# Patient Record
Sex: Male | Born: 1975 | Race: Black or African American | Hispanic: No | Marital: Single | Smoking: Current some day smoker
Health system: Southern US, Community
[De-identification: ages and names within clinical notes are randomized; demographics above are authoritative.]

## PROBLEM LIST (undated history)

## (undated) DIAGNOSIS — I219 Acute myocardial infarction, unspecified: Secondary | ICD-10-CM

## (undated) HISTORY — PX: FINGER SURGERY: SHX640

## (undated) HISTORY — PX: ROTATOR CUFF REPAIR: SHX139

---

## 1998-07-02 ENCOUNTER — Encounter: Payer: Self-pay | Admitting: Emergency Medicine

## 1998-07-02 ENCOUNTER — Emergency Department (HOSPITAL_COMMUNITY): Admission: EM | Admit: 1998-07-02 | Discharge: 1998-07-02 | Payer: Self-pay | Admitting: Emergency Medicine

## 1998-11-25 ENCOUNTER — Encounter: Payer: Self-pay | Admitting: Emergency Medicine

## 1998-11-25 ENCOUNTER — Emergency Department (HOSPITAL_COMMUNITY): Admission: EM | Admit: 1998-11-25 | Discharge: 1998-11-25 | Payer: Self-pay | Admitting: Emergency Medicine

## 1999-02-01 ENCOUNTER — Emergency Department (HOSPITAL_COMMUNITY): Admission: EM | Admit: 1999-02-01 | Discharge: 1999-02-01 | Payer: Self-pay | Admitting: Emergency Medicine

## 1999-03-29 ENCOUNTER — Emergency Department (HOSPITAL_COMMUNITY): Admission: EM | Admit: 1999-03-29 | Discharge: 1999-03-29 | Payer: Self-pay | Admitting: Emergency Medicine

## 1999-06-02 ENCOUNTER — Emergency Department (HOSPITAL_COMMUNITY): Admission: EM | Admit: 1999-06-02 | Discharge: 1999-06-02 | Payer: Self-pay | Admitting: Emergency Medicine

## 1999-10-03 ENCOUNTER — Encounter: Payer: Self-pay | Admitting: Emergency Medicine

## 1999-10-03 ENCOUNTER — Emergency Department (HOSPITAL_COMMUNITY): Admission: EM | Admit: 1999-10-03 | Discharge: 1999-10-03 | Payer: Self-pay | Admitting: Emergency Medicine

## 2000-01-27 ENCOUNTER — Emergency Department (HOSPITAL_COMMUNITY): Admission: EM | Admit: 2000-01-27 | Discharge: 2000-01-27 | Payer: Self-pay

## 2000-05-08 ENCOUNTER — Emergency Department (HOSPITAL_COMMUNITY): Admission: EM | Admit: 2000-05-08 | Discharge: 2000-05-08 | Payer: Self-pay | Admitting: Emergency Medicine

## 2000-05-12 ENCOUNTER — Emergency Department (HOSPITAL_COMMUNITY): Admission: EM | Admit: 2000-05-12 | Discharge: 2000-05-12 | Payer: Self-pay | Admitting: Emergency Medicine

## 2000-05-12 ENCOUNTER — Encounter: Payer: Self-pay | Admitting: Emergency Medicine

## 2000-11-18 ENCOUNTER — Emergency Department (HOSPITAL_COMMUNITY): Admission: EM | Admit: 2000-11-18 | Discharge: 2000-11-18 | Payer: Self-pay | Admitting: Emergency Medicine

## 2002-01-25 ENCOUNTER — Emergency Department (HOSPITAL_COMMUNITY): Admission: EM | Admit: 2002-01-25 | Discharge: 2002-01-25 | Payer: Self-pay | Admitting: *Deleted

## 2002-01-25 ENCOUNTER — Encounter: Payer: Self-pay | Admitting: *Deleted

## 2004-06-27 ENCOUNTER — Emergency Department (HOSPITAL_COMMUNITY): Admission: EM | Admit: 2004-06-27 | Discharge: 2004-06-28 | Payer: Self-pay | Admitting: Emergency Medicine

## 2004-12-28 ENCOUNTER — Ambulatory Visit: Payer: Self-pay | Admitting: Internal Medicine

## 2005-08-17 ENCOUNTER — Emergency Department (HOSPITAL_COMMUNITY): Admission: EM | Admit: 2005-08-17 | Discharge: 2005-08-17 | Payer: Self-pay | Admitting: Emergency Medicine

## 2005-09-02 ENCOUNTER — Emergency Department (HOSPITAL_COMMUNITY): Admission: EM | Admit: 2005-09-02 | Discharge: 2005-09-02 | Payer: Self-pay | Admitting: Emergency Medicine

## 2007-03-11 ENCOUNTER — Emergency Department (HOSPITAL_COMMUNITY): Admission: EM | Admit: 2007-03-11 | Discharge: 2007-03-11 | Payer: Self-pay | Admitting: Emergency Medicine

## 2007-05-18 ENCOUNTER — Observation Stay (HOSPITAL_COMMUNITY): Admission: EM | Admit: 2007-05-18 | Discharge: 2007-05-18 | Payer: Self-pay | Admitting: Emergency Medicine

## 2007-05-21 ENCOUNTER — Emergency Department (HOSPITAL_COMMUNITY): Admission: EM | Admit: 2007-05-21 | Discharge: 2007-05-22 | Payer: Self-pay | Admitting: Emergency Medicine

## 2007-05-22 ENCOUNTER — Ambulatory Visit: Payer: Self-pay | Admitting: Internal Medicine

## 2007-08-27 ENCOUNTER — Emergency Department (HOSPITAL_COMMUNITY): Admission: EM | Admit: 2007-08-27 | Discharge: 2007-08-27 | Payer: Self-pay | Admitting: Emergency Medicine

## 2007-09-05 ENCOUNTER — Emergency Department (HOSPITAL_COMMUNITY): Admission: EM | Admit: 2007-09-05 | Discharge: 2007-09-05 | Payer: Self-pay | Admitting: Emergency Medicine

## 2007-09-24 ENCOUNTER — Emergency Department (HOSPITAL_COMMUNITY): Admission: EM | Admit: 2007-09-24 | Discharge: 2007-09-24 | Payer: Self-pay | Admitting: Emergency Medicine

## 2007-11-13 ENCOUNTER — Emergency Department (HOSPITAL_COMMUNITY): Admission: EM | Admit: 2007-11-13 | Discharge: 2007-11-13 | Payer: Self-pay | Admitting: Emergency Medicine

## 2007-11-14 ENCOUNTER — Ambulatory Visit (HOSPITAL_COMMUNITY): Admission: RE | Admit: 2007-11-14 | Discharge: 2007-11-14 | Payer: Self-pay | Admitting: Specialist

## 2008-02-12 ENCOUNTER — Emergency Department (HOSPITAL_COMMUNITY): Admission: EM | Admit: 2008-02-12 | Discharge: 2008-02-12 | Payer: Self-pay | Admitting: Emergency Medicine

## 2008-02-29 ENCOUNTER — Emergency Department (HOSPITAL_COMMUNITY): Admission: EM | Admit: 2008-02-29 | Discharge: 2008-02-29 | Payer: Self-pay | Admitting: Emergency Medicine

## 2008-07-22 ENCOUNTER — Emergency Department (HOSPITAL_COMMUNITY): Admission: EM | Admit: 2008-07-22 | Discharge: 2008-07-23 | Payer: Self-pay | Admitting: *Deleted

## 2008-08-03 ENCOUNTER — Emergency Department (HOSPITAL_COMMUNITY): Admission: EM | Admit: 2008-08-03 | Discharge: 2008-08-03 | Payer: Self-pay | Admitting: Emergency Medicine

## 2008-08-04 ENCOUNTER — Emergency Department (HOSPITAL_COMMUNITY): Admission: EM | Admit: 2008-08-04 | Discharge: 2008-08-04 | Payer: Self-pay | Admitting: Emergency Medicine

## 2008-08-19 ENCOUNTER — Emergency Department (HOSPITAL_COMMUNITY): Admission: EM | Admit: 2008-08-19 | Discharge: 2008-08-19 | Payer: Self-pay | Admitting: Emergency Medicine

## 2008-08-31 ENCOUNTER — Emergency Department (HOSPITAL_COMMUNITY): Admission: EM | Admit: 2008-08-31 | Discharge: 2008-09-01 | Payer: Self-pay | Admitting: Emergency Medicine

## 2008-09-30 ENCOUNTER — Emergency Department (HOSPITAL_COMMUNITY): Admission: EM | Admit: 2008-09-30 | Discharge: 2008-09-30 | Payer: Self-pay | Admitting: Emergency Medicine

## 2008-10-05 ENCOUNTER — Emergency Department (HOSPITAL_COMMUNITY): Admission: EM | Admit: 2008-10-05 | Discharge: 2008-10-05 | Payer: Self-pay | Admitting: Emergency Medicine

## 2008-10-21 ENCOUNTER — Emergency Department (HOSPITAL_COMMUNITY): Admission: EM | Admit: 2008-10-21 | Discharge: 2008-10-21 | Payer: Self-pay | Admitting: Emergency Medicine

## 2008-11-10 ENCOUNTER — Emergency Department (HOSPITAL_COMMUNITY): Admission: EM | Admit: 2008-11-10 | Discharge: 2008-11-10 | Payer: Self-pay | Admitting: Family Medicine

## 2008-12-12 ENCOUNTER — Emergency Department (HOSPITAL_COMMUNITY): Admission: EM | Admit: 2008-12-12 | Discharge: 2008-12-12 | Payer: Self-pay | Admitting: Emergency Medicine

## 2009-01-26 ENCOUNTER — Emergency Department (HOSPITAL_COMMUNITY): Admission: EM | Admit: 2009-01-26 | Discharge: 2009-01-26 | Payer: Self-pay | Admitting: Emergency Medicine

## 2009-02-05 ENCOUNTER — Encounter: Admission: RE | Admit: 2009-02-05 | Discharge: 2009-03-19 | Payer: Self-pay | Admitting: Orthopedic Surgery

## 2009-04-25 ENCOUNTER — Emergency Department (HOSPITAL_COMMUNITY): Admission: EM | Admit: 2009-04-25 | Discharge: 2009-04-25 | Payer: Self-pay | Admitting: Emergency Medicine

## 2009-05-04 ENCOUNTER — Emergency Department (HOSPITAL_COMMUNITY): Admission: EM | Admit: 2009-05-04 | Discharge: 2009-05-04 | Payer: Self-pay | Admitting: Emergency Medicine

## 2009-05-20 ENCOUNTER — Emergency Department (HOSPITAL_COMMUNITY): Admission: EM | Admit: 2009-05-20 | Discharge: 2009-05-20 | Payer: Self-pay | Admitting: Advanced Practice Midwife

## 2009-06-24 ENCOUNTER — Encounter: Admission: RE | Admit: 2009-06-24 | Discharge: 2009-09-22 | Payer: Self-pay | Admitting: Orthopedic Surgery

## 2009-07-06 ENCOUNTER — Emergency Department (HOSPITAL_COMMUNITY): Admission: EM | Admit: 2009-07-06 | Discharge: 2009-07-06 | Payer: Self-pay | Admitting: Emergency Medicine

## 2009-08-16 ENCOUNTER — Emergency Department (HOSPITAL_COMMUNITY): Admission: EM | Admit: 2009-08-16 | Discharge: 2009-08-16 | Payer: Self-pay | Admitting: Emergency Medicine

## 2009-08-19 ENCOUNTER — Ambulatory Visit (HOSPITAL_BASED_OUTPATIENT_CLINIC_OR_DEPARTMENT_OTHER): Admission: RE | Admit: 2009-08-19 | Discharge: 2009-08-19 | Payer: Self-pay | Admitting: Orthopedic Surgery

## 2009-09-10 ENCOUNTER — Emergency Department (HOSPITAL_COMMUNITY): Admission: EM | Admit: 2009-09-10 | Discharge: 2009-09-10 | Payer: Self-pay | Admitting: Emergency Medicine

## 2009-09-14 ENCOUNTER — Emergency Department (HOSPITAL_COMMUNITY): Admission: EM | Admit: 2009-09-14 | Discharge: 2009-09-14 | Payer: Self-pay | Admitting: Emergency Medicine

## 2009-09-16 ENCOUNTER — Emergency Department (HOSPITAL_COMMUNITY): Admission: EM | Admit: 2009-09-16 | Discharge: 2009-09-16 | Payer: Self-pay | Admitting: Emergency Medicine

## 2009-09-24 ENCOUNTER — Encounter: Admission: RE | Admit: 2009-09-24 | Discharge: 2009-10-07 | Payer: Self-pay | Admitting: Orthopedic Surgery

## 2009-10-03 ENCOUNTER — Emergency Department (HOSPITAL_COMMUNITY): Admission: EM | Admit: 2009-10-03 | Discharge: 2009-10-03 | Payer: Self-pay | Admitting: Emergency Medicine

## 2009-10-13 ENCOUNTER — Emergency Department (HOSPITAL_COMMUNITY): Admission: EM | Admit: 2009-10-13 | Discharge: 2009-10-13 | Payer: Self-pay | Admitting: Emergency Medicine

## 2009-10-18 ENCOUNTER — Emergency Department (HOSPITAL_COMMUNITY): Admission: EM | Admit: 2009-10-18 | Discharge: 2009-10-18 | Payer: Self-pay | Admitting: Emergency Medicine

## 2009-11-12 ENCOUNTER — Emergency Department (HOSPITAL_COMMUNITY): Admission: EM | Admit: 2009-11-12 | Discharge: 2009-11-12 | Payer: Self-pay | Admitting: Emergency Medicine

## 2009-11-18 ENCOUNTER — Emergency Department (HOSPITAL_COMMUNITY): Admission: EM | Admit: 2009-11-18 | Discharge: 2009-11-18 | Payer: Self-pay | Admitting: Emergency Medicine

## 2009-12-07 ENCOUNTER — Emergency Department (HOSPITAL_COMMUNITY): Admission: EM | Admit: 2009-12-07 | Discharge: 2009-12-07 | Payer: Self-pay | Admitting: Emergency Medicine

## 2009-12-08 ENCOUNTER — Emergency Department (HOSPITAL_COMMUNITY): Admission: EM | Admit: 2009-12-08 | Discharge: 2009-12-08 | Payer: Self-pay | Admitting: Emergency Medicine

## 2009-12-09 ENCOUNTER — Encounter: Admission: RE | Admit: 2009-12-09 | Discharge: 2009-12-09 | Payer: Self-pay | Admitting: *Deleted

## 2010-09-12 LAB — BASIC METABOLIC PANEL
BUN: 12 mg/dL (ref 6–23)
CO2: 25 mEq/L (ref 19–32)
Calcium: 8.6 mg/dL (ref 8.4–10.5)
Chloride: 105 mEq/L (ref 96–112)
Creatinine, Ser: 1.29 mg/dL (ref 0.4–1.5)
GFR calc Af Amer: >60 mL/min (ref >60)
GFR calc non Af Amer: >60 mL/min (ref >60)
Glucose, Bld: 124 mg/dL — ABNORMAL HIGH (ref 70–99)
Potassium: 4.1 mEq/L (ref 3.5–5.1)
Sodium: 136 mEq/L (ref 135–145)

## 2010-09-22 LAB — BASIC METABOLIC PANEL
CO2: 24 mEq/L (ref 19–32)
Calcium: 8.5 mg/dL (ref 8.4–10.5)
Chloride: 108 mEq/L (ref 96–112)
GFR calc Af Amer: >60 mL/min (ref >60)
Potassium: 3.8 mEq/L (ref 3.5–5.1)
Sodium: 139 mEq/L (ref 135–145)

## 2010-09-22 LAB — RAPID URINE DRUG SCREEN, HOSP PERFORMED
Barbiturates: NOT DETECTED
Benzodiazepines: POSITIVE — AB
Cocaine: NOT DETECTED

## 2010-09-22 LAB — URINALYSIS, ROUTINE W REFLEX MICROSCOPIC
Bilirubin Urine: NEGATIVE
Hgb urine dipstick: NEGATIVE
Ketones, ur: NEGATIVE mg/dL
Nitrite: NEGATIVE
Protein, ur: NEGATIVE mg/dL
Urobilinogen, UA: 1 mg/dL (ref 0.0–1.0)

## 2010-09-22 LAB — CBC
HCT: 41.7 % (ref 39.0–52.0)
Hemoglobin: 14.3 g/dL (ref 13.0–17.0)
MCHC: 34.2 g/dL (ref 30.0–36.0)
RBC: 4.39 MIL/uL (ref 4.22–5.81)

## 2010-09-22 LAB — DIFFERENTIAL
Basophils Relative: 1 % (ref 0–1)
Eosinophils Relative: 4 % (ref 0–5)
Monocytes Absolute: 0.5 10*3/uL (ref 0.1–1.0)
Monocytes Relative: 8 % (ref 3–12)
Neutro Abs: 2.8 10*3/uL (ref 1.7–7.7)

## 2010-09-22 LAB — APTT: aPTT: 27 seconds (ref 24–37)

## 2010-09-22 LAB — URINE MICROSCOPIC-ADD ON

## 2010-09-28 LAB — URINALYSIS, ROUTINE W REFLEX MICROSCOPIC
Glucose, UA: NEGATIVE mg/dL
pH: 6 (ref 5.0–8.0)

## 2010-09-28 LAB — URINE MICROSCOPIC-ADD ON

## 2010-11-02 NOTE — H&P (Signed)
NAMEKENZIE, FLAKES         ACCOUNT NO.:  [id number]   MEDICAL RECORD NO.:  [id number]          PATIENT TYPE:  OBV   LOCATION:  1411                         FACILITY:  [facility]   PHYSICIAN:  Lonia Blood, M.D.      DATE OF BIRTH:  [birth year]   DATE OF ADMISSION:  05/17/2007  DATE OF DISCHARGE:                              HISTORY & PHYSICAL   CHIEF COMPLAINT:  Rectal bleeding.   HISTORY OF PRESENT ILLNESS:  The patient is a 35 year old, African  American male with no significant past medical history.  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  There are no records  to support that.  He came in secondary to 3 days of rectal bleeding with  multiple bowel movements, associated with some abdominal cramp, no  nausea or vomiting.  The patient has not had any prior GI history, no  history of hemorrhoids.  His pain was persistent in addition to the  bleed.  Hence, he came to the emergency room.  Currently, he denied any  specific symptoms except for the bleed.  His abdominal pain has subsided  in the hospital.  It was initially 10/10, now 2/10.   PAST MEDICAL HISTORY:  MI indicated in 1999, but no records to support  that.   ALLERGIES:  SULFA.   MEDICATIONS:  None.   SOCIAL HISTORY:  The patient is single and lives in [place] with his  girlfriend.  He smokes about a pack per day.  Denied alcohol or IV drug  abuse.   FAMILY HISTORY:  Denied any family history of GI cancers or any  malignancy or any GI problems.   REVIEW OF SYSTEMS:  A 12-point review of systems is negative except for  HPI.   PHYSICAL EXAMINATION:  VITAL SIGNS:  Temperature 98.7, blood pressure  105/64, pulse 75, respirations 20, saturations 97% on room air.  The  patient is not orthostatic.  GENERAL:  He is awake, alert and oriented in no acute distress.  HEENT:  PERRLA.  EOMI.  NECK:  Supple.  No JVD, no lymphadenopathy.  LUNGS:  Good air entry bilaterally.  No wheezes or rales.  CARDIAC:  S1, S2, no murmurs.  ABDOMEN:  Soft, nontender, positive bowel sounds.  EXTREMITIES:  No clubbing, cyanosis or edema.  RECTAL:  Guaiac is positive grossly.   LABORATORY DATA AND X-RAY FINDINGS:  White count 6.3, hemoglobin 15,  platelet count 245, normal differential.  Sodium 140, potassium 3.9,  chloride 104, CO2 34, glucose 77, BUN 18, creatinine 1.49, calcium 9.0.  INR 1.0, PT 13.8.   CT of abdomen and pelvis essentially negative.   ASSESSMENT:  This is a 35 year old gentleman presenting with multiple  bouts of rectal bleed today with some abdominal pain.  The patient's  workup so far seems negative except for the guaiac stools.  It appears  that this may be hemorrhoidal bleed from all indications.  However, the  patient denied any prior history.  His rectal exam did show some mild  rectal hemorrhoids, but not clear if that is the cause of his bleed.  The patient's BUN is  actually normal indicating that this is likely to  be lower if anything than upper gastrointestinal bleed.   PLAN:  1. Lower gastrointestinal bleed.  We will admit the patient and keep      him monitored for observation.  Will check his serial CBC to make      sure his hemoglobin stays stable.  Will consider inpatient or      outpatient gastrointestinal workup depending on how stable he is.      Meanwhile, intravenous fluids and I will type and cross-match him      for packed red blood cells and hold in case we need to transfuse.  2. Tobacco abuse.  I have counseled the patient and I will put      nicotine patch on the patient now.  I will also check urine screen      as well.  3. Questionable coronary artery disease.  This is many years ago and      the patient has no symptoms right now.  4. Respiratory alkalosis.  This may be related to the patient's      smoking and maybe he has basilar chronic obstructive pulmonary      disease, but he is not having any shortness of breath.  Final      treatment  will depend on how the patient's bleeding evolves in the      hospital.      Lonia Blood, M.D.  Electronically Signed     LG/MEDQ  D:  05/18/2007  T:  05/18/2007  Job:  [id number]

## 2010-11-02 NOTE — Discharge Summary (Signed)
Jared Spencer, Jared Spencer         ACCOUNT NO.:  [id number]   MEDICAL RECORD NO.:  [id number]          PATIENT TYPE:  OBV   LOCATION:  1411                         FACILITY:  [facility]   PHYSICIAN:  Hillery Aldo, M.D.   DATE OF BIRTH:  Oct 15, 1975   DATE OF ADMISSION:  05/17/2007  DATE OF DISCHARGE:                               DISCHARGE SUMMARY   PRIMARY CARE PHYSICIAN:  None.   DISCHARGE DIAGNOSES:  1. Rectal bleeding secondary to internal hemorrhoids.  2. Possible irritable bowel syndrome.  3. Polysubstance abuse including cocaine.   DISCHARGE MEDICATIONS:  1. Anusol-HC suppositories q.6 h. p.r.n. rectal pain or bleeding.  2. Levsin 0.125 mg sublingual 1-2 tablets q.4 h. p.r.n. abdominal      pain.   CONSULTATION:  Dr. Leone Payor of gastroenterology.   BRIEF ADMISSION HPI:  The patient is a 35 year old male who presented to  the emergency department with a chief complaint of bright red and maroon-  colored stools accompanied by mid epigastric pain.  The patient reported  that he had had a history of ulcers in the past and was having a fair  amount of discomfort.  He was admitted for 23-hour observation.   PROCEDURES AND DIAGNOSTIC STUDIES:  1. CT scan of the abdomen and pelvis on May 18, 2007 showed no      acute upper abdominal or pelvic abnormalities.  2. Flexible proctosigmoidoscopy by Dr. Leone Payor on May 18, 2007      showed internal hemorrhoids, which was thought to be the cause of      bleeding.   DISCHARGE LABORATORY VALUES:  Showed his white blood cell count was 4.9,  hemoglobin 14.1, hematocrit 40, platelet count 220,000.   HOSPITAL COURSE:  1. Rectal bleeding:  The patient was admitted and put on IV proton      pump inhibitor medication.  He was kept n.p.o. and a GI      consultation was requested and kindly provided by Dr. Leone Payor given      that he had a prior history of peptic ulcer disease.  The patient      did undergo flexible sigmoidoscopy with  findings as noted above.      He was put on a combination of hydrocortisone suppositories and      Levsin for treatment of abdominal spasms.  It was also recommended      that the patient avoid any ingestion of cocaine due to the fact      that this can cause significant vasospasm and ischemic problems.      The patient denied using cocaine but agreed to stay away from it in      the future.  The patient was also appraised of the risk of cocaine      use with regard to heart disease and strokes.  The patient had no      significant drop in his hemoglobin throughout his hospital stay.      He therefore is medically stable and will be discharged home.  2. Polysubstance abuse:  The patient's urine drug screen was positive      for cocaine and opiates.  It is unclear if he had received opiates      in the emergency department prior to the collection of the urine      sample sent for drug screening.  Nevertheless, the patient was      counseled extensively with regard to the importance of avoidance of      illicit substances to avoid problems in the future with      cerebrovascular, cardiovascular, or gastrointestinal complications.      The patient was fairly medication-seeking, particularly with      narcotics, throughout his hospital stay, frequently requesting      doses of pain medicine of the nursing staff and frequently asking      whether he would be sent home with pain medications.  The patient      was advised the pain medications will worsen his constipation, and      he should avoid constipation to help prevent problems with his      hemorrhoids.   DISPOSITION:  The patient is medically stable and is being discharged  home.      Hillery Aldo, M.D.  Electronically Signed     CR/MEDQ  D:  05/18/2007  T:  05/18/2007  Job:  [id number]

## 2011-03-14 LAB — CBC
Hemoglobin: 13.9
MCV: 92.6
Platelets: 269
RDW: 13.9

## 2011-03-28 LAB — COMPREHENSIVE METABOLIC PANEL
ALT: 24
Alkaline Phosphatase: 54
CO2: 25
Glucose, Bld: 89
Potassium: 3.5
Sodium: 141
Total Protein: 6.4

## 2011-03-28 LAB — DIFFERENTIAL
Basophils Absolute: 0
Basophils Relative: 1
Monocytes Absolute: 0.6
Neutro Abs: 2.9
Neutrophils Relative %: 43

## 2011-03-28 LAB — CBC
Hemoglobin: 15.3
RBC: 4.62
RDW: 13.1

## 2011-03-28 LAB — URINALYSIS, ROUTINE W REFLEX MICROSCOPIC
Hgb urine dipstick: NEGATIVE
Nitrite: NEGATIVE
Protein, ur: NEGATIVE
Urobilinogen, UA: 0.2

## 2011-03-28 LAB — LIPASE, BLOOD: Lipase: 33

## 2011-03-28 LAB — URINE MICROSCOPIC-ADD ON

## 2011-03-29 LAB — CK TOTAL AND CKMB (NOT AT ARMC): Relative Index: 0.3

## 2011-03-29 LAB — CROSSMATCH

## 2011-03-29 LAB — CBC
HCT: 40.3
Hemoglobin: 14.1
Hemoglobin: 14.3
Hemoglobin: 15
MCHC: 35.5
MCV: 93.4
Platelets: 220
Platelets: 223
RBC: 4.52
RDW: 13.4
WBC: 5.7

## 2011-03-29 LAB — RAPID URINE DRUG SCREEN, HOSP PERFORMED
Barbiturates: NOT DETECTED
Opiates: POSITIVE — AB

## 2011-03-29 LAB — BASIC METABOLIC PANEL
CO2: 34 — ABNORMAL HIGH
Chloride: 104
Creatinine, Ser: 1.49
GFR calc Af Amer: >60
Sodium: 140

## 2011-03-29 LAB — DIFFERENTIAL
Basophils Relative: 0
Eosinophils Absolute: 0.3
Monocytes Absolute: 0.3
Monocytes Relative: 5

## 2011-03-29 LAB — TROPONIN I: Troponin I: 0.03

## 2011-03-29 LAB — CARDIAC PANEL(CRET KIN+CKTOT+MB+TROPI)
CK, MB: 2.2
Relative Index: 0.4

## 2011-03-29 LAB — ABO/RH: ABO/RH(D): O POS

## 2011-03-29 LAB — TSH: TSH: 3.248

## 2011-11-28 ENCOUNTER — Encounter (HOSPITAL_COMMUNITY): Payer: Self-pay | Admitting: *Deleted

## 2011-11-28 ENCOUNTER — Emergency Department (HOSPITAL_COMMUNITY)
Admission: EM | Admit: 2011-11-28 | Discharge: 2011-11-28 | Disposition: A | Discharge status: Home / Self Care | Payer: Medicaid Other | Attending: Emergency Medicine | Admitting: Emergency Medicine

## 2011-11-28 DIAGNOSIS — F17200 Nicotine dependence, unspecified, uncomplicated: Secondary | ICD-10-CM | POA: Insufficient documentation

## 2011-11-28 DIAGNOSIS — IMO0002 Reserved for concepts with insufficient information to code with codable children: Secondary | ICD-10-CM | POA: Insufficient documentation

## 2011-11-28 DIAGNOSIS — Y998 Other external cause status: Secondary | ICD-10-CM | POA: Insufficient documentation

## 2011-11-28 DIAGNOSIS — Y9389 Activity, other specified: Secondary | ICD-10-CM | POA: Insufficient documentation

## 2011-11-28 DIAGNOSIS — S29019A Strain of muscle and tendon of unspecified wall of thorax, initial encounter: Secondary | ICD-10-CM

## 2011-11-28 DIAGNOSIS — X58XXXA Exposure to other specified factors, initial encounter: Secondary | ICD-10-CM | POA: Insufficient documentation

## 2011-11-28 MED ORDER — PREDNISONE 50 MG PO TABS: 50.0000 mg | ORAL_TABLET | Freq: Every day | ORAL | Status: AC

## 2011-11-28 MED ORDER — HYDROCODONE-ACETAMINOPHEN 5-325 MG PO TABS: 1.0000 | ORAL_TABLET | Freq: Four times a day (QID) | ORAL | Status: AC | PRN

## 2011-11-28 MED ORDER — CYCLOBENZAPRINE HCL 10 MG PO TABS: 10.0000 mg | ORAL_TABLET | Freq: Three times a day (TID) | ORAL | Status: AC | PRN

## 2011-11-28 NOTE — Discharge Instructions (Signed)
Return here as needed for any worsening in your condition.  Use ice and heat on your back.  Avoid heavy lifting over the next 7-10 days.

## 2011-11-28 NOTE — ED Provider Notes (Signed)
Medical screening examination/treatment/procedure(s) were performed by non-physician practitioner and as supervising physician I was immediately available for consultation/collaboration.   Forbes Cellar, MD 11/28/11 2032

## 2011-11-28 NOTE — ED Provider Notes (Signed)
History     CSN: [id number]  Arrival date & time 11/28/11  1859   First MD Initiated Contact with Patient 11/28/11 2001      Chief Complaint  Patient presents with  . Back Pain    (Consider location/radiation/quality/duration/timing/severity/associated sxs/prior treatment) HPI Patient presents emergency department with mid lateral right back pain that started this morning while carrying 2 buckets each of 5 gallons of paint.  Patient, states he tripped and when he tripped.  He felt pain, and pulling in his right.  Patient, states the pain has persisted throughout the day and is worse with movement and palpation.  Patient, states he did not fall away to the ground.  Patient denies numbness, weakness, vomiting, nausea, or neck pain.  Patient, states he took Tylenol with minimal relief. History reviewed. No pertinent past medical history.  History reviewed. No pertinent past surgical history.  No family history on file.  History  Substance Use Topics  . Smoking status: Current Everyday Smoker -- 0.2 packs/day  . Smokeless tobacco: Not on file  . Alcohol Use: No      Review of Systems All other systems negative except as documented in the HPI. All pertinent positives and negatives as reviewed in the HPI.  Allergies  Ibuprofen; Sulfa antibiotics; and Benadryl  Home Medications   Current Outpatient Rx  Name Route Sig Dispense Refill  . ACETAMINOPHEN 500 MG PO TABS Oral Take 1,000 mg by mouth every 6 (six) hours as needed. Pain.      BP 128/79  Pulse 60  Temp(Src) 98.6 F (37 C) (Oral)  Resp 18  Wt 180 lb (81.647 kg)  SpO2 100%  Physical Exam  Vitals reviewed. Constitutional: He is oriented to person, place, and time. He appears well-developed and well-nourished.  HENT:  Head: Atraumatic.  Cardiovascular: Normal rate and regular rhythm.   Pulmonary/Chest: Effort normal and breath sounds normal.  Musculoskeletal:       Thoracic back: He exhibits tenderness, pain  and spasm. He exhibits normal range of motion, no bony tenderness and no deformity.       Back:  Neurological: He is alert and oriented to person, place, and time. He has normal strength. No sensory deficit. Coordination and gait normal.  Reflex Scores:      Tricep reflexes are 2+ on the right side and 2+ on the left side.      Bicep reflexes are 2+ on the right side and 2+ on the left side.      Brachioradialis reflexes are 2+ on the right side and 2+ on the left side.   ED Course  Procedures (including critical care time)  Patient has a muscular strain based on his history of present illness and physical exam done.  Patient is told to return here for any worsening in his condition.  I advised him to use ice and heat on his back.  I told him to avoid heavy lifting over the next 7-10 days.  He should not have any trauma and did not fall to the ground, so, therefore, I did not order any x-rays  MDM          Carlyle Dolly, PA-C 11/28/11 2020

## 2011-11-28 NOTE — ED Notes (Signed)
Pt c/o hurting upper back this morning carrying something; feels like may have pulled something; increase in pain during day

## 2012-02-02 ENCOUNTER — Emergency Department (HOSPITAL_COMMUNITY)
Admission: EM | Admit: 2012-02-02 | Discharge: 2012-02-02 | Disposition: A | Discharge status: Home / Self Care | Payer: Medicaid Other | Attending: Emergency Medicine | Admitting: Emergency Medicine

## 2012-02-02 ENCOUNTER — Encounter (HOSPITAL_COMMUNITY): Payer: Self-pay | Admitting: Emergency Medicine

## 2012-02-02 ENCOUNTER — Emergency Department (HOSPITAL_COMMUNITY): Payer: Medicaid Other

## 2012-02-02 DIAGNOSIS — M79609 Pain in unspecified limb: Secondary | ICD-10-CM | POA: Insufficient documentation

## 2012-02-02 DIAGNOSIS — F17200 Nicotine dependence, unspecified, uncomplicated: Secondary | ICD-10-CM | POA: Insufficient documentation

## 2012-02-02 DIAGNOSIS — M79642 Pain in left hand: Secondary | ICD-10-CM

## 2012-02-02 MED ORDER — IBUPROFEN 400 MG PO TABS
400.0000 mg | ORAL_TABLET | Freq: Once | ORAL | Status: DC
  Filled 2012-02-02: qty 1

## 2012-02-02 MED ORDER — OXYCODONE-ACETAMINOPHEN 5-325 MG PO TABS
2.0000 | ORAL_TABLET | Freq: Once | ORAL | Status: AC
  Administered 2012-02-02: 2 via ORAL
  Filled 2012-02-02: qty 2

## 2012-02-02 MED ORDER — OXYCODONE-ACETAMINOPHEN 5-325 MG PO TABS: 1.0000 | ORAL_TABLET | ORAL | Status: AC | PRN

## 2012-02-02 NOTE — ED Notes (Signed)
Pt fell off a ladder 1 month ago falling on his wrist c/o pain to wrist and hand

## 2012-02-02 NOTE — ED Provider Notes (Signed)
History  Scribed for Jared Razor, MD, the patient was seen in room TR05C/TR05C. This chart was scribed by Candelaria Stagers. The patient's care started at 11:09 AM    CSN: [id number]  Arrival date & time 02/02/12  6295   First MD Initiated Contact with Patient 02/02/12 1106      Chief Complaint  Patient presents with  . Wrist Pain     The history is provided by the patient.   TAIM WURM is a 36 y.o. male who presents to the Emergency Department complaining of left wrist pain after falling off a ladder about four weeks ago landing on his wrist.  Pt reports that the pain is worsening.  He states that the pain is worse when carrying objects.  Nothing seems to make the pain better or worse.    History reviewed. No pertinent past medical history.  History reviewed. No pertinent past surgical history.  History reviewed. No pertinent family history.  History  Substance Use Topics  . Smoking status: Current Everyday Smoker -- 0.2 packs/day  . Smokeless tobacco: Not on file  . Alcohol Use: No      Review of Systems  Constitutional: Negative for fever.       10 Systems reviewed and are negative for acute change except as noted in the HPI.  HENT: Negative for congestion.   Eyes: Negative for discharge and redness.  Respiratory: Negative for cough and shortness of breath.   Cardiovascular: Negative for chest pain.  Gastrointestinal: Negative for vomiting and abdominal pain.  Musculoskeletal: Positive for arthralgias (left wrist pain). Negative for back pain.  Skin: Negative for rash.  Neurological: Negative for syncope, numbness and headaches.  Psychiatric/Behavioral:       No behavior change.    Allergies  Ibuprofen; Sulfa antibiotics; and Benadryl  Home Medications  No current outpatient prescriptions on file.  BP 112/82  Pulse 63  Temp 98.5 F (36.9 C) (Oral)  Resp 17  SpO2 100%  Physical Exam  Nursing note and vitals reviewed. Constitutional:   Awake, alert, nontoxic appearance.  HENT:  Head: Atraumatic.  Eyes: Right eye exhibits no discharge. Left eye exhibits no discharge.  Neck: Neck supple.  Cardiovascular: Normal rate and regular rhythm.   Pulmonary/Chest: Effort normal and breath sounds normal. He exhibits no tenderness.  Abdominal: Soft. There is no tenderness. There is no rebound.  Musculoskeletal: He exhibits no tenderness.       Tenderness to the distal ulna of the left wrist.  Tenderness along the mid to distal 5th metatarsal.  Skin intact.  Radial nerve intact.  Good cap refill distally.    Neurological:       Mental status and motor strength appears baseline for patient and situation.  Skin: No rash noted.  Psychiatric: He has a normal mood and affect.    ED Course  Procedures   DIAGNOSTIC STUDIES: Oxygen Saturation is 100% on room air, normal by my interpretation.    COORDINATION OF CARE:  1018 Ordered: DG Wrist Complete Left; DG Hand Complete Left   Labs Reviewed - No data to display Dg Wrist Complete Left  02/02/2012  *RADIOLOGY REPORT*  Clinical Data: Injury  LEFT WRIST - COMPLETE 3+ VIEW  Comparison: None.  Findings: No acute fracture and no dislocation.  Curvilinear radiodensity is present within the soft tissues of the thenar eminence.  IMPRESSION: No acute bony pathology.  Radiopaque foreign body within the thenar soft tissues.  Original Report Authenticated By: Donavan Burnet, M.D.  Dg Hand Complete Left  02/02/2012  *RADIOLOGY REPORT*  Clinical Data: Injury  LEFT HAND - COMPLETE 3+ VIEW  Comparison: 12/07/2009  Findings: Curvilinear radio opaque foreign body in the thenar soft tissues is stable.  Soft tissue swelling about the PIP joint of the index finger and long finger is stable.  Tiny bony density adjacent to the head of the proximal phalanx of the index finger has a chronic appearance.  No definite acute fracture.  IMPRESSION: No acute bony pathology.  Chronic changes.  Original Report  Authenticated By: Donavan Burnet, M.D.     1. Hand pain, left       MDM  36 year old male with hand pain. X-rays are negative for acute osseous injury. There is no tenderness in anatomic snuffbox. Foreign body noted on x-ray does not correspond to patient's injury. Likely chronic. Patient is neurovascularly intact. Plain systematic treatment. Return precautions were discussed.  I personally preformed the services scribed in my presence. The recorded information has been reviewed and considered. Jared Razor, MD.        Jared Razor, MD 02/09/12 1021

## 2012-02-02 NOTE — ED Notes (Signed)
Pt reports falling 4 weeks ago and catching full body weight onto left hand.  Full rom but pain increasing.  Swelling has resolved but pain increasing.  Pt reports decreased strength in pinky and ring finger, decreased grip.  No obvious deformity or edema.  Pt alert oriented X4

## 2012-04-14 ENCOUNTER — Encounter (HOSPITAL_COMMUNITY): Payer: Self-pay | Admitting: Emergency Medicine

## 2012-04-14 DIAGNOSIS — Z8679 Personal history of other diseases of the circulatory system: Secondary | ICD-10-CM | POA: Insufficient documentation

## 2012-04-14 DIAGNOSIS — W108XXA Fall (on) (from) other stairs and steps, initial encounter: Secondary | ICD-10-CM | POA: Insufficient documentation

## 2012-04-14 DIAGNOSIS — Z79899 Other long term (current) drug therapy: Secondary | ICD-10-CM | POA: Insufficient documentation

## 2012-04-14 DIAGNOSIS — M545 Low back pain, unspecified: Secondary | ICD-10-CM | POA: Insufficient documentation

## 2012-04-14 DIAGNOSIS — F17200 Nicotine dependence, unspecified, uncomplicated: Secondary | ICD-10-CM | POA: Insufficient documentation

## 2012-04-14 DIAGNOSIS — I2510 Atherosclerotic heart disease of native coronary artery without angina pectoris: Secondary | ICD-10-CM | POA: Insufficient documentation

## 2012-04-14 DIAGNOSIS — I252 Old myocardial infarction: Secondary | ICD-10-CM | POA: Insufficient documentation

## 2012-04-14 DIAGNOSIS — Y9389 Activity, other specified: Secondary | ICD-10-CM | POA: Insufficient documentation

## 2012-04-14 DIAGNOSIS — S46909A Unspecified injury of unspecified muscle, fascia and tendon at shoulder and upper arm level, unspecified arm, initial encounter: Secondary | ICD-10-CM | POA: Insufficient documentation

## 2012-04-14 DIAGNOSIS — S4980XA Other specified injuries of shoulder and upper arm, unspecified arm, initial encounter: Secondary | ICD-10-CM | POA: Insufficient documentation

## 2012-04-14 DIAGNOSIS — S0993XA Unspecified injury of face, initial encounter: Secondary | ICD-10-CM | POA: Insufficient documentation

## 2012-04-14 DIAGNOSIS — Y9289 Other specified places as the place of occurrence of the external cause: Secondary | ICD-10-CM | POA: Insufficient documentation

## 2012-04-14 NOTE — ED Notes (Addendum)
Pt reports he fell down eleven steps approx. One hour ago and is now having back neck and shoulder pain no deformity noted

## 2012-04-15 ENCOUNTER — Emergency Department (HOSPITAL_COMMUNITY)
Admission: EM | Admit: 2012-04-15 | Discharge: 2012-04-15 | Disposition: A | Discharge status: Home / Self Care | Payer: Medicaid Other | Attending: Emergency Medicine | Admitting: Emergency Medicine

## 2012-04-15 ENCOUNTER — Emergency Department (HOSPITAL_COMMUNITY): Payer: Medicaid Other

## 2012-04-15 DIAGNOSIS — M545 Low back pain: Secondary | ICD-10-CM

## 2012-04-15 DIAGNOSIS — W19XXXA Unspecified fall, initial encounter: Secondary | ICD-10-CM

## 2012-04-15 DIAGNOSIS — M25519 Pain in unspecified shoulder: Secondary | ICD-10-CM

## 2012-04-15 HISTORY — DX: Acute myocardial infarction, unspecified: I21.9

## 2012-04-15 MED ORDER — HYDROCODONE-ACETAMINOPHEN 5-325 MG PO TABS: ORAL_TABLET | ORAL | Status: DC

## 2012-04-15 MED ORDER — METHOCARBAMOL 500 MG PO TABS: 1000.0000 mg | ORAL_TABLET | Freq: Four times a day (QID) | ORAL | Status: DC

## 2012-04-15 MED ORDER — OXYCODONE-ACETAMINOPHEN 5-325 MG PO TABS
1.0000 | ORAL_TABLET | Freq: Once | ORAL | Status: AC
  Administered 2012-04-15: 1 via ORAL
  Filled 2012-04-15: qty 1

## 2012-04-15 NOTE — ED Provider Notes (Signed)
History     CSN: [id number]  Arrival date & time 04/14/12  2036   First MD Initiated Contact with Patient 04/15/12 0126      Chief Complaint  Patient presents with  . Back Pain  . Fall  . Neck Injury  . Shoulder Injury    (Consider location/radiation/quality/duration/timing/severity/associated sxs/prior treatment) HPI Comments: Patient presents with complaint of right shoulder, middle back, lower back pain that started acutely approximately 7 hours ago when he fell down approximately 11 stairs. Patient states he wasn't paying attention to what he was doing and tripped. Patient denies striking his head. He denies loss of consciousness. No blurry vision, nausea, or vomiting. Patient denies weakness, numbness, tingling in his lower or upper extremities. No treatments prior to arrival. Onset acute. Course is constant. Movement of his shoulder and lower back make the pain worse. Nothing makes it better.  Patient is a 36 y.o. male presenting with back pain, fall, neck injury, and shoulder injury. The history is provided by the patient.  Back Pain  Pertinent negatives include no chest pain, no numbness, no headaches and no weakness.  Fall Pertinent negatives include no numbness, no nausea, no vomiting and no headaches.  Neck Injury Associated symptoms include arthralgias. Pertinent negatives include no chest pain, fatigue, headaches, nausea, neck pain, numbness, vomiting or weakness.  Shoulder Injury Associated symptoms include arthralgias. Pertinent negatives include no chest pain, fatigue, headaches, nausea, neck pain, numbness, vomiting or weakness.    Past Medical History  Diagnosis Date  . Heart attack   . Coronary artery disease     History reviewed. No pertinent past surgical history.  History reviewed. No pertinent family history.  History  Substance Use Topics  . Smoking status: Current Some Day Smoker -- 0.2 packs/day  . Smokeless tobacco: Not on file  . Alcohol Use:  No      Review of Systems  Constitutional: Negative for fatigue.  HENT: Negative for neck pain and tinnitus.   Eyes: Negative for photophobia, pain and visual disturbance.  Respiratory: Negative for shortness of breath.   Cardiovascular: Negative for chest pain.  Gastrointestinal: Negative for nausea and vomiting.  Musculoskeletal: Positive for back pain and arthralgias. Negative for gait problem.  Skin: Negative for wound.  Neurological: Negative for dizziness, weakness, light-headedness, numbness and headaches.  Psychiatric/Behavioral: Negative for confusion and decreased concentration.    Allergies  Ibuprofen; Sulfa antibiotics; and Benadryl  Home Medications   Current Outpatient Rx  Name Route Sig Dispense Refill  . ADULT MULTIVITAMIN W/MINERALS CH Oral Take 1 tablet by mouth daily.      BP 125/71  Pulse 79  Temp 98 F (36.7 C) (Oral)  Resp 18  SpO2 99%  Physical Exam  Nursing note and vitals reviewed. Constitutional: He is oriented to person, place, and time. He appears well-developed and well-nourished.  HENT:  Head: Normocephalic and atraumatic. Head is without raccoon's eyes and without Battle's sign.  Right Ear: Tympanic membrane, external ear and ear canal normal. No hemotympanum.  Left Ear: Tympanic membrane, external ear and ear canal normal. No hemotympanum.  Nose: Nose normal. No nasal septal hematoma.  Mouth/Throat: Oropharynx is clear and moist.  Eyes: Conjunctivae normal, EOM and lids are normal. Pupils are equal, round, and reactive to light.       No visible hyphema  Neck: Normal range of motion. Neck supple.  Cardiovascular: Normal rate and regular rhythm.   Pulmonary/Chest: Effort normal and breath sounds normal.  Abdominal: Soft. There is no  tenderness. There is no CVA tenderness.  Musculoskeletal: Normal range of motion. He exhibits tenderness.       Right shoulder: He exhibits tenderness. He exhibits normal range of motion, no bony  tenderness, no swelling, normal pulse and normal strength.       Left shoulder: Normal.       Cervical back: He exhibits normal range of motion, no tenderness and no bony tenderness.       Thoracic back: He exhibits tenderness. He exhibits no bony tenderness.       Lumbar back: He exhibits tenderness and bony tenderness.       Back:       No step-off noted with palpation of spine.   Neurological: He is alert and oriented to person, place, and time. He has normal strength and normal reflexes. No cranial nerve deficit or sensory deficit. He exhibits normal muscle tone. Coordination normal. GCS eye subscore is 4. GCS verbal subscore is 5. GCS motor subscore is 6.       5/5 strength in entire lower extremities bilaterally. No sensation deficit.   Skin: Skin is warm and dry.  Psychiatric: He has a normal mood and affect.    ED Course  Procedures (including critical care time)  Labs Reviewed - No data to display Dg Lumbar Spine Complete  04/15/2012  *RADIOLOGY REPORT*  Clinical Data: Low back pain after fall.  LUMBAR SPINE - COMPLETE 4+ VIEW  Comparison: 10/21/2008.  CT 12/09/2009  Findings: Atrophic ribs are demonstrated at the L1 vertebra.  A transitional S1 segment.  Normal alignment of the lumbar vertebrae and facet joints.  No vertebral compression deformities. Intervertebral disc space heights are preserved.  No focal bone lesion or bone destruction.  Bone cortex and trabecular architecture appear intact.  No significant changes since the previous study.  IMPRESSION: No displaced fractures identified.   Original Report Authenticated By: Marlon Pel, M.D.    Dg Shoulder Right  04/15/2012  *RADIOLOGY REPORT*  Clinical Data: Anterior and lateral right shoulder pain after fall.  RIGHT SHOULDER - 2+ VIEW  Comparison: 10/03/2009  Findings: The right shoulder appears intact. No evidence of acute fracture or subluxation.  No focal bone lesions.  Bone matrix and cortex appear intact.  No  abnormal radiopaque densities in the soft tissues.  No significant change since previous study.  IMPRESSION: No acute bony abnormalities.   Original Report Authenticated By: Marlon Pel, M.D.      1. Fall   2. Shoulder pain   3. Low back pain     1:48 AM Patient seen and examined. Work-up initiated. Medications ordered.   Vital signs reviewed and are as follows: Filed Vitals:   04/14/12 2040  BP: 125/71  Pulse: 79  Temp: 98 F (36.7 C)  Resp: 18   X-ray reviewed by myself. Radiologist report reviewed. No acute process. Findings reviewed with patient.   Patient was counseled on RICE protocol and told to rest injury, use ice for no longer than 15 minutes every hour, compress the area, and elevate above the level of their heart as much as possible to reduce swelling.  Questions answered.  Patient verbalized understanding.    Patient counseled on use of narcotic pain medications. Counseled not to combine these medications with others containing tylenol. Urged not to drink alcohol, drive, or perform any other activities that requires focus while taking these medications. The patient verbalizes understanding and agrees with the plan.  Patient counseled on proper use of  muscle relaxant medication.  They were told not to drink alcohol, drive any vehicle, or do any dangerous activities while taking this medication.  Patient verbalized understanding.  MDM  Fall: x-rays neg, no abnormal neuro findings. Conservative management indicated.         Renne Crigler, [place] 04/16/12 1904

## 2012-04-15 NOTE — Progress Notes (Signed)
Orthopedic Tech Progress Note Patient Details:  Jared Spencer 02-20-1976 [id number]  Ortho Devices Type of Ortho Device: Sling immobilizer   Haskell Flirt 04/15/2012, 2:25 AM

## 2012-04-23 NOTE — ED Provider Notes (Signed)
Medical screening examination/treatment/procedure(s) were performed by non-physician practitioner and as supervising physician I was immediately available for consultation/collaboration.   Gavin Pound. Oletta Lamas, MD 04/23/12 2122

## 2012-05-28 ENCOUNTER — Other Ambulatory Visit: Payer: Self-pay | Admitting: Family Medicine

## 2012-05-28 DIAGNOSIS — M25511 Pain in right shoulder: Secondary | ICD-10-CM

## 2012-05-30 ENCOUNTER — Ambulatory Visit
Admission: RE | Admit: 2012-05-30 | Discharge: 2012-05-30 | Disposition: A | Discharge status: Home / Self Care | Payer: Medicaid Other | Source: Ambulatory Visit | Attending: Family Medicine | Admitting: Family Medicine

## 2012-05-30 DIAGNOSIS — M25511 Pain in right shoulder: Secondary | ICD-10-CM

## 2012-07-31 ENCOUNTER — Emergency Department (HOSPITAL_COMMUNITY)
Admission: EM | Admit: 2012-07-31 | Discharge: 2012-07-31 | Disposition: A | Discharge status: Home / Self Care | Payer: Medicaid Other | Attending: Emergency Medicine | Admitting: Emergency Medicine

## 2012-07-31 ENCOUNTER — Encounter (HOSPITAL_COMMUNITY): Payer: Self-pay | Admitting: Emergency Medicine

## 2012-07-31 ENCOUNTER — Emergency Department (HOSPITAL_COMMUNITY): Payer: Medicaid Other

## 2012-07-31 DIAGNOSIS — Y929 Unspecified place or not applicable: Secondary | ICD-10-CM | POA: Insufficient documentation

## 2012-07-31 DIAGNOSIS — S4980XA Other specified injuries of shoulder and upper arm, unspecified arm, initial encounter: Secondary | ICD-10-CM | POA: Insufficient documentation

## 2012-07-31 DIAGNOSIS — I252 Old myocardial infarction: Secondary | ICD-10-CM | POA: Insufficient documentation

## 2012-07-31 DIAGNOSIS — R5381 Other malaise: Secondary | ICD-10-CM | POA: Insufficient documentation

## 2012-07-31 DIAGNOSIS — IMO0001 Reserved for inherently not codable concepts without codable children: Secondary | ICD-10-CM | POA: Insufficient documentation

## 2012-07-31 DIAGNOSIS — Y9389 Activity, other specified: Secondary | ICD-10-CM | POA: Insufficient documentation

## 2012-07-31 DIAGNOSIS — I2510 Atherosclerotic heart disease of native coronary artery without angina pectoris: Secondary | ICD-10-CM | POA: Insufficient documentation

## 2012-07-31 DIAGNOSIS — W208XXA Other cause of strike by thrown, projected or falling object, initial encounter: Secondary | ICD-10-CM | POA: Insufficient documentation

## 2012-07-31 DIAGNOSIS — S4992XA Unspecified injury of left shoulder and upper arm, initial encounter: Secondary | ICD-10-CM

## 2012-07-31 DIAGNOSIS — S46909A Unspecified injury of unspecified muscle, fascia and tendon at shoulder and upper arm level, unspecified arm, initial encounter: Secondary | ICD-10-CM | POA: Insufficient documentation

## 2012-07-31 MED ORDER — OXYCODONE-ACETAMINOPHEN 5-325 MG PO TABS: 1.0000 | ORAL_TABLET | ORAL | Status: DC | PRN

## 2012-07-31 NOTE — ED Notes (Signed)
Ortho returned page  

## 2012-07-31 NOTE — ED Notes (Signed)
Pt reports changing out a water heater last night. Water heater fell and pt tried to catch it. Pt went to work today, picked up a bucket and pain shot down left arm causing him to drop the bucket.

## 2012-07-31 NOTE — ED Provider Notes (Signed)
History     CSN: [id number]  Arrival date & time 07/31/12  1050   First MD Initiated Contact with Patient 07/31/12 1129      Chief Complaint  Patient presents with  . Shoulder Pain    left shoulder    (Consider location/radiation/quality/duration/timing/severity/associated sxs/prior treatment) HPI Comments: History provided by pt. 37 y.o. male presents today complaining of acute onset left shoulder pain after trying to catch a falling water heater last night. Pain was worse today. Worse with movement, better at rest. Pt rates pain as severe. Pt took nothing for the pain.    Patient is a 37 y.o. male presenting with shoulder pain.  Shoulder Pain Associated symptoms include arthralgias and weakness. Pertinent negatives include no chest pain, diaphoresis, fever, headaches, nausea, neck pain, numbness, rash or vomiting.    Past Medical History  Diagnosis Date  . Heart attack   . Coronary artery disease     History reviewed. No pertinent past surgical history.  No family history on file.  History  Substance Use Topics  . Smoking status: Current Some Day Smoker -- 0.25 packs/day  . Smokeless tobacco: Not on file  . Alcohol Use: No      Review of Systems  Constitutional: Negative for fever and diaphoresis.  HENT: Negative for neck pain and neck stiffness.   Eyes: Negative for visual disturbance.  Respiratory: Negative for apnea, chest tightness and shortness of breath.   Cardiovascular: Negative for chest pain and palpitations.  Gastrointestinal: Negative for nausea, vomiting, diarrhea and constipation.  Genitourinary: Negative for dysuria.  Musculoskeletal: Positive for arthralgias. Negative for gait problem.       Left shoulder pain radiating down arm s/p injury  Skin: Negative for color change and rash.  Neurological: Positive for weakness. Negative for dizziness, light-headedness, numbness and headaches.    Allergies  Ibuprofen; Sulfa antibiotics; and  Benadryl  Home Medications   Current Outpatient Rx  Name  Route  Sig  Dispense  Refill  . Multiple Vitamin (MULTIVITAMIN WITH MINERALS) TABS   Oral   Take 1 tablet by mouth daily.           BP 124/70  Pulse 60  Temp(Src) 97.8 F (36.6 C) (Oral)  Resp 16  SpO2 100%  Physical Exam  Nursing note and vitals reviewed. Constitutional: He is oriented to person, place, and time. He appears well-developed and well-nourished. No distress.  HENT:  Head: Normocephalic and atraumatic.  Eyes: EOM are normal. Pupils are equal, round, and reactive to light.  Neck: Normal range of motion. Neck supple.  No meningeal signs  Cardiovascular: Normal rate, regular rhythm and normal heart sounds.  Exam reveals no gallop and no friction rub.   No murmur heard. Pulmonary/Chest: Effort normal and breath sounds normal. No respiratory distress. He has no wheezes. He has no rales. He exhibits no tenderness.  Abdominal: Soft. Bowel sounds are normal. He exhibits no distension. There is no tenderness. There is no rebound and no guarding.  Musculoskeletal: Normal range of motion. He exhibits no edema and no tenderness.  ROM to injured extremity painful, but capable. No swelling. No bruising. Not tender to palpation, just with movement. Strength 4/5 in injured extremity.   Neurological: He is alert and oriented to person, place, and time. No cranial nerve deficit. Coordination normal.  No focal deficits. Sensation to light touch intact.  Skin: Skin is warm and dry. He is not diaphoretic. No erythema.    ED Course  Procedures (including critical  care time)  Labs Reviewed - No data to display Dg Shoulder Left  07/31/2012  *RADIOLOGY REPORT*  Clinical Data: Left shoulder pain.  Injury.  LEFT SHOULDER - 2+ VIEW  Comparison: None.  Findings: No bony abnormality.  No fracture, subluxation or dislocation.  Joint spaces are maintained.  Visualized left hemithorax intact.  IMPRESSION: No bony abnormality.    Original Report Authenticated By: Charlett Nose, M.D.      Diagnosis: left shoulder injury    MDM  Imaging shows no fracture. Directed pt to alternate heat and ice, take acetaminophen (allergy to ibuprofen) for pain, and to rest the injury when possible. Provided sling for comfort and support.  At this time there does not appear to be any evidence of an acute emergency medical condition and the patient appears stable for discharge with appropriate outpatient follow up including his PCP and orthopedist. Diagnosis was discussed with patient who verbalizes understanding and is agreeable to discharge.   Glade Nurse, PA-C 07/31/12 2056

## 2012-07-31 NOTE — ED Notes (Signed)
Ortho paged. 

## 2012-08-01 NOTE — ED Provider Notes (Signed)
Medical screening examination/treatment/procedure(s) were performed by non-physician practitioner and as supervising physician I was immediately available for consultation/collaboration.   Leroy Pettway B. Admiral Marcucci, MD 08/01/12 1010 

## 2012-09-24 ENCOUNTER — Encounter (HOSPITAL_COMMUNITY): Payer: Self-pay | Admitting: Emergency Medicine

## 2012-09-24 ENCOUNTER — Emergency Department (HOSPITAL_COMMUNITY)
Admission: EM | Admit: 2012-09-24 | Discharge: 2012-09-24 | Disposition: A | Discharge status: Home / Self Care | Payer: Medicaid Other | Attending: Emergency Medicine | Admitting: Emergency Medicine

## 2012-09-24 DIAGNOSIS — F17200 Nicotine dependence, unspecified, uncomplicated: Secondary | ICD-10-CM | POA: Insufficient documentation

## 2012-09-24 DIAGNOSIS — M25511 Pain in right shoulder: Secondary | ICD-10-CM

## 2012-09-24 DIAGNOSIS — Z9889 Other specified postprocedural states: Secondary | ICD-10-CM | POA: Insufficient documentation

## 2012-09-24 DIAGNOSIS — S46909A Unspecified injury of unspecified muscle, fascia and tendon at shoulder and upper arm level, unspecified arm, initial encounter: Secondary | ICD-10-CM | POA: Insufficient documentation

## 2012-09-24 DIAGNOSIS — Y92410 Unspecified street and highway as the place of occurrence of the external cause: Secondary | ICD-10-CM | POA: Insufficient documentation

## 2012-09-24 DIAGNOSIS — I2510 Atherosclerotic heart disease of native coronary artery without angina pectoris: Secondary | ICD-10-CM | POA: Insufficient documentation

## 2012-09-24 DIAGNOSIS — Z8674 Personal history of sudden cardiac arrest: Secondary | ICD-10-CM | POA: Insufficient documentation

## 2012-09-24 DIAGNOSIS — S4980XA Other specified injuries of shoulder and upper arm, unspecified arm, initial encounter: Secondary | ICD-10-CM | POA: Insufficient documentation

## 2012-09-24 DIAGNOSIS — Y939 Activity, unspecified: Secondary | ICD-10-CM | POA: Insufficient documentation

## 2012-09-24 MED ORDER — OXYCODONE-ACETAMINOPHEN 5-325 MG PO TABS
2.0000 | ORAL_TABLET | Freq: Once | ORAL | Status: AC
  Administered 2012-09-24: 2 via ORAL
  Filled 2012-09-24: qty 2

## 2012-09-24 MED ORDER — OXYCODONE-ACETAMINOPHEN 5-325 MG PO TABS: 2.0000 | ORAL_TABLET | Freq: Four times a day (QID) | ORAL | Status: DC | PRN
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
## 2012-09-29 NOTE — ED Provider Notes (Signed)
Medical screening examination/treatment/procedure(s) were performed by non-physician practitioner and as supervising physician I was immediately available for consultation/collaboration.  Jesten Cappuccio T Brandan Robicheaux, MD 09/29/12 0745 

## 2013-07-13 ENCOUNTER — Emergency Department (HOSPITAL_COMMUNITY)
Admission: EM | Admit: 2013-07-13 | Discharge: 2013-07-13 | Disposition: A | Discharge status: Home / Self Care | Payer: Medicaid Other | Attending: Emergency Medicine | Admitting: Emergency Medicine

## 2013-07-13 ENCOUNTER — Encounter (HOSPITAL_COMMUNITY): Payer: Self-pay | Admitting: Emergency Medicine

## 2013-07-13 DIAGNOSIS — S339XXA Sprain of unspecified parts of lumbar spine and pelvis, initial encounter: Secondary | ICD-10-CM | POA: Insufficient documentation

## 2013-07-13 DIAGNOSIS — I252 Old myocardial infarction: Secondary | ICD-10-CM | POA: Insufficient documentation

## 2013-07-13 DIAGNOSIS — I2510 Atherosclerotic heart disease of native coronary artery without angina pectoris: Secondary | ICD-10-CM | POA: Insufficient documentation

## 2013-07-13 DIAGNOSIS — F17200 Nicotine dependence, unspecified, uncomplicated: Secondary | ICD-10-CM | POA: Insufficient documentation

## 2013-07-13 DIAGNOSIS — Y92410 Unspecified street and highway as the place of occurrence of the external cause: Secondary | ICD-10-CM | POA: Insufficient documentation

## 2013-07-13 DIAGNOSIS — S139XXA Sprain of joints and ligaments of unspecified parts of neck, initial encounter: Secondary | ICD-10-CM | POA: Insufficient documentation

## 2013-07-13 DIAGNOSIS — S39012A Strain of muscle, fascia and tendon of lower back, initial encounter: Secondary | ICD-10-CM

## 2013-07-13 DIAGNOSIS — S335XXA Sprain of ligaments of lumbar spine, initial encounter: Principal | ICD-10-CM

## 2013-07-13 DIAGNOSIS — S161XXA Strain of muscle, fascia and tendon at neck level, initial encounter: Secondary | ICD-10-CM

## 2013-07-13 DIAGNOSIS — Y9389 Activity, other specified: Secondary | ICD-10-CM | POA: Insufficient documentation

## 2013-07-13 MED ORDER — HYDROCODONE-ACETAMINOPHEN 5-325 MG PO TABS: 1.0000 | ORAL_TABLET | Freq: Four times a day (QID) | ORAL | Status: DC | PRN

## 2013-07-13 MED ORDER — HYDROCODONE-ACETAMINOPHEN 5-325 MG PO TABS
2.0000 | ORAL_TABLET | Freq: Once | ORAL | Status: AC
  Administered 2013-07-13: 2 via ORAL
  Filled 2013-07-13: qty 2

## 2013-07-13 MED ORDER — CYCLOBENZAPRINE HCL 10 MG PO TABS: 10.0000 mg | ORAL_TABLET | Freq: Two times a day (BID) | ORAL | Status: DC | PRN

## 2013-07-13 NOTE — ED Notes (Signed)
Discharge delayed due to pain medication administration.  Rn will monitor.
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
## 2013-07-14 NOTE — ED Provider Notes (Signed)
Medical screening examination/treatment/procedure(s) were performed by non-physician practitioner and as supervising physician I was immediately available for consultation/collaboration.  EKG Interpretation   None         Gwyneth SproutWhitney Sakari Alkhatib, MD 07/14/13 2130

## 2013-09-22 ENCOUNTER — Encounter (HOSPITAL_COMMUNITY): Payer: Self-pay | Admitting: Emergency Medicine

## 2013-09-22 ENCOUNTER — Emergency Department (HOSPITAL_COMMUNITY)
Admission: EM | Admit: 2013-09-22 | Discharge: 2013-09-23 | Disposition: A | Discharge status: Home / Self Care | Payer: Medicaid Other | Attending: Emergency Medicine | Admitting: Emergency Medicine

## 2013-09-22 DIAGNOSIS — Y92009 Unspecified place in unspecified non-institutional (private) residence as the place of occurrence of the external cause: Secondary | ICD-10-CM | POA: Insufficient documentation

## 2013-09-22 DIAGNOSIS — Y9389 Activity, other specified: Secondary | ICD-10-CM | POA: Insufficient documentation

## 2013-09-22 DIAGNOSIS — W260XXA Contact with knife, initial encounter: Secondary | ICD-10-CM | POA: Insufficient documentation

## 2013-09-22 DIAGNOSIS — I252 Old myocardial infarction: Secondary | ICD-10-CM | POA: Insufficient documentation

## 2013-09-22 DIAGNOSIS — S61209A Unspecified open wound of unspecified finger without damage to nail, initial encounter: Secondary | ICD-10-CM | POA: Insufficient documentation

## 2013-09-22 DIAGNOSIS — F17200 Nicotine dependence, unspecified, uncomplicated: Secondary | ICD-10-CM | POA: Insufficient documentation

## 2013-09-22 DIAGNOSIS — I509 Heart failure, unspecified: Secondary | ICD-10-CM | POA: Insufficient documentation

## 2013-09-22 DIAGNOSIS — W261XXA Contact with sword or dagger, initial encounter: Secondary | ICD-10-CM

## 2013-09-22 DIAGNOSIS — S61219A Laceration without foreign body of unspecified finger without damage to nail, initial encounter: Secondary | ICD-10-CM

## 2013-09-22 NOTE — ED Notes (Signed)
Pt states he was at home sharpening his knife and cut his left index finger

## 2013-09-23 ENCOUNTER — Emergency Department (HOSPITAL_COMMUNITY): Payer: Medicaid Other

## 2013-09-23 MED ORDER — HYDROCODONE-ACETAMINOPHEN 5-325 MG PO TABS
1.0000 | ORAL_TABLET | ORAL | Status: DC | PRN
Start: 2013-09-23 — End: 2014-08-10

## 2013-09-23 MED ORDER — HYDROCODONE-ACETAMINOPHEN 5-325 MG PO TABS
2.0000 | ORAL_TABLET | Freq: Once | ORAL | Status: AC
  Administered 2013-09-23: 2 via ORAL
  Filled 2013-09-23: qty 2

## 2013-09-23 NOTE — ED Provider Notes (Signed)
Medical screening examination/treatment/procedure(s) were performed by non-physician practitioner and as supervising physician I was immediately available for consultation/collaboration.   EKG Interpretation None       Derwood KaplanAnkit Momo Braun, MD 09/23/13 [id number]

## 2013-09-23 NOTE — ED Provider Notes (Signed)
CSN: [id number]     Arrival date & time 09/22/13  2227 History   First MD Initiated Contact with Patient 09/22/13 2358     Chief Complaint  Patient presents with  . Extremity Laceration     (Consider location/radiation/quality/duration/timing/severity/associated sxs/prior Treatment) HPI 38 yo male presents with laceration to Left index finger that occurred earlier this morning approximately 12 hours ago. Patient sharpening knife this morning cut his left index finger just distal to PIP joint. Denies numbness or weakness of affected hand/digit. Last tetanus reported 3-4 years ago.   Past Medical History  Diagnosis Date  . Heart attack   . Coronary artery disease    Past Surgical History  Procedure Laterality Date  . Rotator cuff repair Right   . Finger surgery Left    Family History  Problem Relation Age of Onset  . Heart attack Other    History  Substance Use Topics  . Smoking status: Current Some Day Smoker -- 0.25 packs/day    Types: Cigarettes  . Smokeless tobacco: Not on file  . Alcohol Use: No    Review of Systems  All other systems reviewed and are negative.     Allergies  Ibuprofen; Sulfa antibiotics; and Benadryl  Home Medications   Current Outpatient Rx  Name  Route  Sig  Dispense  Refill  . cyclobenzaprine (FLEXERIL) 10 MG tablet   Oral   Take 1 tablet (10 mg total) by mouth 2 (two) times daily as needed for muscle spasms.   20 tablet   0   . HYDROcodone-acetaminophen (NORCO) 5-325 MG per tablet   Oral   Take 1-2 tablets by mouth every 4 (four) hours as needed.   15 tablet   0   . HYDROcodone-acetaminophen (NORCO/VICODIN) 5-325 MG per tablet   Oral   Take 1-2 tablets by mouth every 6 (six) hours as needed for moderate pain.   10 tablet   0    BP 122/66  Pulse 65  Temp(Src) 98.3 F (36.8 C) (Oral)  Resp 16  SpO2 100% Physical Exam  Nursing note and vitals reviewed. Constitutional: He is oriented to person, place, and time. He appears  well-developed and well-nourished. No distress.  HENT:  Head: Normocephalic and atraumatic.  Eyes: Conjunctivae are normal.  Neck: No JVD present. No tracheal deviation present.  Cardiovascular: Normal rate and regular rhythm.  Exam reveals no gallop and no friction rub.   No murmur heard. Pulmonary/Chest: Effort normal. No respiratory distress. He has no wheezes. He has no rhonchi. He has no rales.  Musculoskeletal: Normal range of motion. He exhibits no edema.       Hands: Laceration to dorsum of left index finger as depicted above. Bleeding controlled no evidence of foreign body. No evidence of tendon involvement. Patient has full extension and good flexion of left index finger. Good perfusion of affected digit with capillary refill < 2 sec   Neurological: He is alert and oriented to person, place, and time.  Skin: Skin is warm and dry. He is not diaphoretic.  Psychiatric: He has a normal mood and affect. His behavior is normal.    ED Course  LACERATION REPAIR Date/Time: 09/25/2013 8:39 AM Performed by: Rudene AndaLACKEY, Cherika Jessie, GRAY Authorized by: Rudene AndaLACKEY, Odalys Win, GRAY Consent: Verbal consent obtained. Risks and benefits: risks, benefits and alternatives were discussed Consent given by: patient Patient understanding: patient states understanding of the procedure being performed Patient identity confirmed: verbally with patient Time out: Immediately prior to procedure a "time out"  was called to verify the correct patient, procedure, equipment, support staff and site/side marked as required. Body area: upper extremity Location details: left index finger Laceration length: 1.5 cm Foreign bodies: no foreign bodies Tendon involvement: none Nerve involvement: none Vascular damage: no Anesthesia: digital block Local anesthetic: lidocaine 2% without epinephrine Anesthetic total: 3 ml Preparation: Patient was prepped and draped in the usual sterile fashion. Irrigation solution: saline Irrigation  method: syringe Amount of cleaning: extensive Debridement: none Degree of undermining: none Skin closure: Steri-Strips Patient tolerance: Patient tolerated the procedure well with no immediate complications. Comments: Placed in finger splint with finger in full extension.    (including critical care time) Labs Review Labs Reviewed - No data to display Imaging Review No results found.   EKG Interpretation None      MDM   Final diagnoses:  Finger laceration   Patient presents with acute finger lac. Patient has no evidence of tendon involvement. Laceration steri stripped and placed in finger splint to avoid reseparation of wound. Plan to have patient follow up with PCP in 1 week for wound recheck.   Meds given in ED:  Medications  HYDROcodone-acetaminophen (NORCO/VICODIN) 5-325 MG per tablet 2 tablet (2 tablets Oral Given 09/23/13 0012)    Discharge Medication List as of 09/23/2013  2:00 AM       Rudene Anda, PA-C 09/25/13 [phone]

## 2013-09-23 NOTE — ED Notes (Signed)
PA at bedside.

## 2013-09-23 NOTE — Discharge Instructions (Signed)
Take medications as directed for pain. Do not drive with pain medications. Keep finger splinted as to avoid stress on the laceration. Follow up with your doctor in 1 week for wound recheck. Return to ED if you develop worsening pain or swelling, numbness, or inability to extend your finger.    Laceration Care, Adult A laceration is a cut or lesion that goes through all layers of the skin and into the tissue just beneath the skin. TREATMENT  Some lacerations may not require closure. Some lacerations may not be able to be closed due to an increased risk of infection. It is important to see your caregiver as soon as possible after an injury to minimize the risk of infection and maximize the opportunity for successful closure. If closure is appropriate, pain medicines may be given, if needed. The wound will be cleaned to help prevent infection. Your caregiver will use stitches (sutures), staples, wound glue (adhesive), or skin adhesive strips to repair the laceration. These tools bring the skin edges together to allow for faster healing and a better cosmetic outcome. However, all wounds will heal with a scar. Once the wound has healed, scarring can be minimized by covering the wound with sunscreen during the day for 1 full year. HOME CARE INSTRUCTIONS  For sutures or staples:  Keep the wound clean and dry.  If you were given a bandage (dressing), you should change it at least once a day. Also, change the dressing if it becomes wet or dirty, or as directed by your caregiver.  Wash the wound with soap and water 2 times a day. Rinse the wound off with water to remove all soap. Pat the wound dry with a clean towel.  After cleaning, apply a thin layer of the antibiotic ointment as recommended by your caregiver. This will help prevent infection and keep the dressing from sticking.  You may shower as usual after the first 24 hours. Do not soak the wound in water until the sutures are removed.  Only take  over-the-counter or prescription medicines for pain, discomfort, or fever as directed by your caregiver.  Get your sutures or staples removed as directed by your caregiver. For skin adhesive strips:  Keep the wound clean and dry.  Do not get the skin adhesive strips wet. You may bathe carefully, using caution to keep the wound dry.  If the wound gets wet, pat it dry with a clean towel.  Skin adhesive strips will fall off on their own. You may trim the strips as the wound heals. Do not remove skin adhesive strips that are still stuck to the wound. They will fall off in time. For wound adhesive:  You may briefly wet your wound in the shower or bath. Do not soak or scrub the wound. Do not swim. Avoid periods of heavy perspiration until the skin adhesive has fallen off on its own. After showering or bathing, gently pat the wound dry with a clean towel.  Do not apply liquid medicine, cream medicine, or ointment medicine to your wound while the skin adhesive is in place. This may loosen the film before your wound is healed.  If a dressing is placed over the wound, be careful not to apply tape directly over the skin adhesive. This may cause the adhesive to be pulled off before the wound is healed.  Avoid prolonged exposure to sunlight or tanning lamps while the skin adhesive is in place. Exposure to ultraviolet light in the first year will darken the  scar.  The skin adhesive will usually remain in place for 5 to 10 days, then naturally fall off the skin. Do not pick at the adhesive film. You may need a tetanus shot if:  You cannot remember when you had your last tetanus shot.  You have never had a tetanus shot. If you get a tetanus shot, your arm may swell, get red, and feel warm to the touch. This is common and not a problem. If you need a tetanus shot and you choose not to have one, there is a rare chance of getting tetanus. Sickness from tetanus can be serious. SEEK MEDICAL CARE IF:   You  have redness, swelling, or increasing pain in the wound.  You see a red line that goes away from the wound.  You have yellowish-white fluid (pus) coming from the wound.  You have a fever.  You notice a bad smell coming from the wound or dressing.  Your wound breaks open before or after sutures have been removed.  You notice something coming out of the wound such as wood or glass.  Your wound is on your hand or foot and you cannot move a finger or toe. SEEK IMMEDIATE MEDICAL CARE IF:   Your pain is not controlled with prescribed medicine.  You have severe swelling around the wound causing pain and numbness or a change in color in your arm, hand, leg, or foot.  Your wound splits open and starts bleeding.  You have worsening numbness, weakness, or loss of function of any joint around or beyond the wound.  You develop painful lumps near the wound or on the skin anywhere on your body. MAKE SURE YOU:   Understand these instructions.  Will watch your condition.  Will get help right away if you are not doing well or get worse. Document Released: 06/06/2005 Document Revised: 08/29/2011 Document Reviewed: 11/30/2010 [facility] Patient Information 2014 [place], [place].

## 2013-09-25 NOTE — ED Provider Notes (Signed)
Medical screening examination/treatment/procedure(s) were performed by non-physician practitioner and as supervising physician I was immediately available for consultation/collaboration.   EKG Interpretation None       Derwood KaplanAnkit Yareth Macdonnell, MD 09/25/13 [id number]

## 2014-01-14 ENCOUNTER — Encounter (HOSPITAL_COMMUNITY): Payer: Self-pay | Admitting: Emergency Medicine

## 2014-01-14 ENCOUNTER — Emergency Department (HOSPITAL_COMMUNITY): Payer: Medicaid Other

## 2014-01-14 ENCOUNTER — Emergency Department (HOSPITAL_COMMUNITY)
Admission: EM | Admit: 2014-01-14 | Discharge: 2014-01-14 | Disposition: A | Discharge status: Home / Self Care | Payer: Medicaid Other | Attending: Emergency Medicine | Admitting: Emergency Medicine

## 2014-01-14 DIAGNOSIS — I2510 Atherosclerotic heart disease of native coronary artery without angina pectoris: Secondary | ICD-10-CM | POA: Insufficient documentation

## 2014-01-14 DIAGNOSIS — IMO0002 Reserved for concepts with insufficient information to code with codable children: Secondary | ICD-10-CM | POA: Diagnosis present

## 2014-01-14 DIAGNOSIS — Z9889 Other specified postprocedural states: Secondary | ICD-10-CM | POA: Insufficient documentation

## 2014-01-14 DIAGNOSIS — F17200 Nicotine dependence, unspecified, uncomplicated: Secondary | ICD-10-CM | POA: Insufficient documentation

## 2014-01-14 DIAGNOSIS — S43401A Unspecified sprain of right shoulder joint, initial encounter: Secondary | ICD-10-CM

## 2014-01-14 DIAGNOSIS — S335XXA Sprain of ligaments of lumbar spine, initial encounter: Secondary | ICD-10-CM | POA: Diagnosis not present

## 2014-01-14 DIAGNOSIS — Z79899 Other long term (current) drug therapy: Secondary | ICD-10-CM | POA: Insufficient documentation

## 2014-01-14 DIAGNOSIS — Y9389 Activity, other specified: Secondary | ICD-10-CM | POA: Insufficient documentation

## 2014-01-14 DIAGNOSIS — Y92410 Unspecified street and highway as the place of occurrence of the external cause: Secondary | ICD-10-CM | POA: Insufficient documentation

## 2014-01-14 DIAGNOSIS — S39012A Strain of muscle, fascia and tendon of lower back, initial encounter: Secondary | ICD-10-CM

## 2014-01-14 MED ORDER — TRAMADOL HCL 50 MG PO TABS
50.0000 mg | ORAL_TABLET | Freq: Four times a day (QID) | ORAL | Status: DC | PRN
Start: 2014-01-14 — End: 2014-01-14

## 2014-01-14 MED ORDER — HYDROCODONE-ACETAMINOPHEN 5-325 MG PO TABS: 1.0000 | ORAL_TABLET | Freq: Four times a day (QID) | ORAL | Status: DC | PRN

## 2014-01-14 MED ORDER — TRAMADOL HCL 50 MG PO TABS
50.0000 mg | ORAL_TABLET | Freq: Once | ORAL | Status: AC
  Administered 2014-01-14: 50 mg via ORAL
  Filled 2014-01-14: qty 1

## 2014-01-14 NOTE — Discharge Instructions (Signed)
You may take hydrocodone as need for pain. No driving for the next 6 hours or when taking hydrocodone. Also, do not take tylenol or acetaminophen containing medication when taking hydrocodone.  Follow up with your doctor in 1 week if symptoms fail to improve/resolve. Return to ER if worse, new symptoms, severe pain, other concern.  You were given pain medication in the ER - no driving for the next 4 hours.     Back Pain, Adult Low back pain is very common. About 1 in 5 people have back pain.The cause of low back pain is rarely dangerous. The pain often gets better over time.About half of people with a sudden onset of back pain feel better in just 2 weeks. About 8 in 10 people feel better by 6 weeks.  CAUSES Some common causes of back pain include:  Strain of the muscles or ligaments supporting the spine.  Wear and tear (degeneration) of the spinal discs.  Arthritis.  Direct injury to the back. DIAGNOSIS Most of the time, the direct cause of low back pain is not known.However, back pain can be treated effectively even when the exact cause of the pain is unknown.Answering your caregiver's questions about your overall health and symptoms is one of the most accurate ways to make sure the cause of your pain is not dangerous. If your caregiver needs more information, he or she may order lab work or imaging tests (X-rays or MRIs).However, even if imaging tests show changes in your back, this usually does not require surgery. HOME CARE INSTRUCTIONS For many people, back pain returns.Since low back pain is rarely dangerous, it is often a condition that people can learn to [facility] their own.   Remain active. It is stressful on the back to sit or stand in one place. Do not sit, drive, or stand in one place for more than 30 minutes at a time. Take short walks on level surfaces as soon as pain allows.Try to increase the length of time you walk each day.  Do not stay in bed.Resting more than  1 or 2 days can delay your recovery.  Do not avoid exercise or work.Your body is made to move.It is not dangerous to be active, even though your back may hurt.Your back will likely heal faster if you return to being active before your pain is gone.  Pay attention to your body when you bend and lift. Many people have less discomfortwhen lifting if they bend their knees, keep the load close to their bodies,and avoid twisting. Often, the most comfortable positions are those that put less stress on your recovering back.  Find a comfortable position to sleep. Use a firm mattress and lie on your side with your knees slightly bent. If you lie on your back, put a pillow under your knees.  Only take over-the-counter or prescription medicines as directed by your caregiver. Over-the-counter medicines to reduce pain and inflammation are often the most helpful.Your caregiver may prescribe muscle relaxant drugs.These medicines help dull your pain so you can more quickly return to your normal activities and healthy exercise.  Put ice on the injured area.  Put ice in a plastic bag.  Place a towel between your skin and the bag.  Leave the ice on for 15-20 minutes, 03-04 times a day for the first 2 to 3 days. After that, ice and heat may be alternated to reduce pain and spasms.  Ask your caregiver about trying back exercises and gentle massage. This may be  of some benefit.  Avoid feeling anxious or stressed.Stress increases muscle tension and can worsen back pain.It is important to recognize when you are anxious or stressed and learn ways to manage it.Exercise is a great option. SEEK MEDICAL CARE IF:  You have pain that is not relieved with rest or medicine.  You have pain that does not improve in 1 week.  You have new symptoms.  You are generally not feeling well. SEEK IMMEDIATE MEDICAL CARE IF:   You have pain that radiates from your back into your legs.  You develop new bowel or bladder  control problems.  You have unusual weakness or numbness in your arms or legs.  You develop nausea or vomiting.  You develop abdominal pain.  You feel faint. Document Released: 06/06/2005 Document Revised: 12/06/2011 Document Reviewed: 10/08/2013 [facility] Patient Information 2015 [place], [place]. This information is not intended to replace advice given to you by your health care provider. Make sure you discuss any questions you have with your health care provider.   Lumbosacral Strain Lumbosacral strain is a strain of any of the parts that make up your lumbosacral vertebrae. Your lumbosacral vertebrae are the bones that make up the lower third of your backbone. Your lumbosacral vertebrae are held together by muscles and tough, fibrous tissue (ligaments).  CAUSES  A sudden blow to your back can cause lumbosacral strain. Also, anything that causes an excessive stretch of the muscles in the low back can cause this strain. This is typically seen when people exert themselves strenuously, fall, lift heavy objects, bend, or crouch repeatedly. RISK FACTORS  Physically demanding work.  Participation in pushing or pulling sports or sports that require a sudden twist of the back (tennis, golf, baseball).  Weight lifting.  Excessive lower back curvature.  Forward-tilted pelvis.  Weak back or abdominal muscles or both.  Tight hamstrings. SIGNS AND SYMPTOMS  Lumbosacral strain may cause pain in the area of your injury or pain that moves (radiates) down your leg.  DIAGNOSIS Your health care provider can often diagnose lumbosacral strain through a physical exam. In some cases, you may need tests such as X-ray exams.  TREATMENT  Treatment for your lower back injury depends on many factors that your clinician will have to evaluate. However, most treatment will include the use of anti-inflammatory medicines. HOME CARE INSTRUCTIONS   Avoid hard physical activities (tennis, racquetball,  waterskiing) if you are not in proper physical condition for it. This may aggravate or create problems.  If you have a back problem, avoid sports requiring sudden body movements. Swimming and walking are generally safer activities.  Maintain good posture.  Maintain a healthy weight.  For acute conditions, you may put ice on the injured area.  Put ice in a plastic bag.  Place a towel between your skin and the bag.  Leave the ice on for 20 minutes, 2-3 times a day.  When the low back starts healing, stretching and strengthening exercises may be recommended. SEEK MEDICAL CARE IF:  Your back pain is getting worse.  You experience severe back pain not relieved with medicines. SEEK IMMEDIATE MEDICAL CARE IF:   You have numbness, tingling, weakness, or problems with the use of your arms or legs.  There is a change in bowel or bladder control.  You have increasing pain in any area of the body, including your belly (abdomen).  You notice shortness of breath, dizziness, or feel faint.  You feel sick to your stomach (nauseous), are throwing up (  vomiting), or become sweaty.  You notice discoloration of your toes or legs, or your feet get very cold. MAKE SURE YOU:   Understand these instructions.  Will watch your condition.  Will get help right away if you are not doing well or get worse. Document Released: 03/16/2005 Document Revised: 06/11/2013 Document Reviewed: 01/23/2013 [facility] Patient Information 2015 [place], [place]. This information is not intended to replace advice given to you by your health care provider. Make sure you discuss any questions you have with your health care provider.   Shoulder Sprain A shoulder sprain is the result of damage to the tough, fiber-like tissues (ligaments) that help hold your shoulder in place. The ligaments may be stretched or torn. Besides the main shoulder joint (the ball and socket), there are several smaller joints that connect the bones  in this area. A sprain usually involves one of those joints. Most often it is the acromioclavicular (or AC) joint. That is the joint that connects the collarbone (clavicle) and the shoulder blade (scapula) at the top point of the shoulder blade (acromion). A shoulder sprain is a mild form of what is called a shoulder separation. Recovering from a shoulder sprain may take some time. For some, pain lingers for several months. Most people recover without long term problems. CAUSES   A shoulder sprain is usually caused by some kind of trauma. This might be:  Falling on an outstretched arm.  Being hit hard on the shoulder.  Twisting the arm.  Shoulder sprains are more likely to occur in people who:  Play sports.  Have balance or coordination problems. SYMPTOMS   Pain when you move your shoulder.  Limited ability to move the shoulder.  Swelling and tenderness on top of the shoulder.  Redness or warmth in the shoulder.  Bruising.  A change in the shape of the shoulder. DIAGNOSIS  Your healthcare provider may:  Ask about your symptoms.  Ask about recent activity that might have caused those symptoms.  Examine your shoulder. You may be asked to do simple exercises to test movement. The other shoulder will be examined for comparison.  Order some tests that provide a look inside the body. They can show the extent of the injury. The tests could include:  X-rays.  CT (computed tomography) scan.  MRI (magnetic resonance imaging) scan. RISKS AND COMPLICATIONS  Loss of full shoulder motion.  Ongoing shoulder pain. TREATMENT  How long it takes to recover from a shoulder sprain depends on how severe it was. Treatment options may include:  Rest. You should not use the arm or shoulder until it heals.  Ice. For 2 or 3 days after the injury, put an ice pack on the shoulder up to 4 times a day. It should stay on for 15 to 20 minutes each time. Wrap the ice in a towel so it does not  touch your skin.  Over-the-counter medicine to relieve pain.  A sling or brace. This will keep the arm still while the shoulder is healing.  Physical therapy or rehabilitation exercises. These will help you regain strength and motion. Ask your healthcare provider when it is OK to begin these exercises.  Surgery. The need for surgery is rare with a sprained shoulder, but some people may need surgery to keep the joint in place and reduce pain. HOME CARE INSTRUCTIONS   Ask your healthcare provider about what you should and should not do while your shoulder heals.  Make sure you know how to apply  ice to the correct area of your shoulder.  Talk with your healthcare provider about which medications should be used for pain and swelling.  If rehabilitation therapy will be needed, ask your healthcare provider to refer you to a therapist. If it is not recommended, then ask about at-home exercises. Find out when exercise should begin. SEEK MEDICAL CARE IF:  Your pain, swelling, or redness at the joint increases. SEEK IMMEDIATE MEDICAL CARE IF:   You have a fever.  You cannot move your arm or shoulder. Document Released: 10/23/2008 Document Revised: 08/29/2011 Document Reviewed: 10/23/2008 [facility] Patient Information 2015 [place], [place]. This information is not intended to replace advice given to you by your health care provider. Make sure you discuss any questions you have with your health care provider.
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
## 2014-03-19 ENCOUNTER — Encounter (HOSPITAL_COMMUNITY): Payer: Self-pay | Admitting: Emergency Medicine

## 2014-03-19 ENCOUNTER — Emergency Department (HOSPITAL_COMMUNITY): Payer: Medicaid Other

## 2014-03-19 ENCOUNTER — Emergency Department (HOSPITAL_COMMUNITY)
Admission: EM | Admit: 2014-03-19 | Discharge: 2014-03-19 | Disposition: A | Discharge status: Home / Self Care | Payer: Medicaid Other | Attending: Emergency Medicine | Admitting: Emergency Medicine

## 2014-03-19 DIAGNOSIS — S99929A Unspecified injury of unspecified foot, initial encounter: Secondary | ICD-10-CM | POA: Diagnosis present

## 2014-03-19 DIAGNOSIS — X500XXA Overexertion from strenuous movement or load, initial encounter: Secondary | ICD-10-CM | POA: Insufficient documentation

## 2014-03-19 DIAGNOSIS — Y9389 Activity, other specified: Secondary | ICD-10-CM | POA: Diagnosis not present

## 2014-03-19 DIAGNOSIS — F17200 Nicotine dependence, unspecified, uncomplicated: Secondary | ICD-10-CM | POA: Diagnosis not present

## 2014-03-19 DIAGNOSIS — R296 Repeated falls: Secondary | ICD-10-CM | POA: Insufficient documentation

## 2014-03-19 DIAGNOSIS — S8990XA Unspecified injury of unspecified lower leg, initial encounter: Secondary | ICD-10-CM | POA: Diagnosis not present

## 2014-03-19 DIAGNOSIS — S99919A Unspecified injury of unspecified ankle, initial encounter: Secondary | ICD-10-CM | POA: Diagnosis not present

## 2014-03-19 DIAGNOSIS — Z8679 Personal history of other diseases of the circulatory system: Secondary | ICD-10-CM | POA: Insufficient documentation

## 2014-03-19 DIAGNOSIS — Y9289 Other specified places as the place of occurrence of the external cause: Secondary | ICD-10-CM | POA: Insufficient documentation

## 2014-03-19 DIAGNOSIS — M25562 Pain in left knee: Secondary | ICD-10-CM

## 2014-03-19 MED ORDER — HYDROCODONE-ACETAMINOPHEN 5-325 MG PO TABS
1.0000 | ORAL_TABLET | Freq: Four times a day (QID) | ORAL | Status: DC | PRN
Start: 2014-03-19 — End: 2014-12-05

## 2014-03-19 MED ORDER — TRAMADOL HCL 50 MG PO TABS
50.0000 mg | ORAL_TABLET | Freq: Once | ORAL | Status: DC
Start: 2014-03-19 — End: 2014-03-19

## 2014-03-19 MED ORDER — HYDROCODONE-ACETAMINOPHEN 5-325 MG PO TABS
2.0000 | ORAL_TABLET | Freq: Once | ORAL | Status: AC
  Administered 2014-03-19: 2 via ORAL
  Filled 2014-03-19: qty 2

## 2014-03-19 MED ORDER — ONDANSETRON 8 MG PO TBDP
8.0000 mg | ORAL_TABLET | Freq: Once | ORAL | Status: AC
  Administered 2014-03-19: 8 mg via ORAL
  Filled 2014-03-19: qty 1
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
## 2014-03-19 NOTE — ED Provider Notes (Signed)
Medical screening examination/treatment/procedure(s) were performed by non-physician practitioner and as supervising physician I was immediately available for consultation/collaboration.  Shyan Scalisi T Bryson Gavia, MD 03/19/14 2312 

## 2014-03-19 NOTE — Discharge Instructions (Signed)
Knee Pain °The knee is the complex joint between your thigh and your lower leg. It is made up of bones, tendons, ligaments, and cartilage. The bones that make up the knee are: °· The femur in the thigh. °· The tibia and fibula in the lower leg. °· The patella or kneecap riding in the groove on the lower femur. °CAUSES  °Knee pain is a common complaint with many causes. A few of these causes are: °· Injury, such as: °¨ A ruptured ligament or tendon injury. °¨ Torn cartilage. °· Medical conditions, such as: °¨ Gout °¨ Arthritis °¨ Infections °· Overuse, over training, or overdoing a physical activity. °Knee pain can be minor or severe. Knee pain can accompany debilitating injury. Minor knee problems often respond well to self-care measures or get well on their own. More serious injuries may need medical intervention or even surgery. °SYMPTOMS °The knee is complex. Symptoms of knee problems can vary widely. Some of the problems are: °· Pain with movement and weight bearing. °· Swelling and tenderness. °· Buckling of the knee. °· Inability to straighten or extend your knee. °· Your knee locks and you cannot straighten it. °· Warmth and redness with pain and fever. °· Deformity or dislocation of the kneecap. °DIAGNOSIS  °Determining what is wrong may be very straight forward such as when there is an injury. It can also be challenging because of the complexity of the knee. Tests to make a diagnosis may include: °· Your caregiver taking a history and doing a physical exam. °· Routine X-rays can be used to rule out other problems. X-rays will not reveal a cartilage tear. Some injuries of the knee can be diagnosed by: °· Arthroscopy a surgical technique by which a small video camera is inserted through tiny incisions on the sides of the knee. This procedure is used to examine and repair internal knee joint problems. Tiny instruments can be used during arthroscopy to repair the torn knee cartilage (meniscus). °· Arthrography  is a radiology technique. A contrast liquid is directly injected into the knee joint. Internal structures of the knee joint then become visible on X-ray film. °· An MRI scan is a non X-ray radiology procedure in which magnetic fields and a computer produce two- or three-dimensional images of the inside of the knee. Cartilage tears are often visible using an MRI scanner. MRI scans have largely replaced arthrography in diagnosing cartilage tears of the knee. °· Blood work. °· Examination of the fluid that helps to lubricate the knee joint (synovial fluid). This is done by taking a sample out using a needle and a syringe. °TREATMENT °The treatment of knee problems depends on the cause. Some of these treatments are: °· Depending on the injury, proper casting, splinting, surgery, or physical therapy care will be needed. °· Give yourself adequate recovery time. Do not overuse your joints. If you begin to get sore during workout routines, back off. Slow down or do fewer repetitions. °· For repetitive activities such as cycling or running, maintain your strength and nutrition. °· Alternate muscle groups. For example, if you are a weight lifter, work the upper body on one day and the lower body the next. °· Either tight or weak muscles do not give the proper support for your knee. Tight or weak muscles do not absorb the stress placed on the knee joint. Keep the muscles surrounding the knee strong. °· Take care of mechanical problems. °· If you have flat feet, orthotics or special shoes may help.   See your caregiver if you need help. °· Arch supports, sometimes with wedges on the inner or outer aspect of the heel, can help. These can shift pressure away from the side of the knee most bothered by osteoarthritis. °· A brace called an "unloader" brace also may be used to help ease the pressure on the most arthritic side of the knee. °· If your caregiver has prescribed crutches, braces, wraps or ice, use as directed. The acronym  for this is PRICE. This means protection, rest, ice, compression, and elevation. °· Nonsteroidal anti-inflammatory drugs (NSAIDs), can help relieve pain. But if taken immediately after an injury, they may actually increase swelling. Take NSAIDs with food in your stomach. Stop them if you develop stomach problems. Do not take these if you have a history of ulcers, stomach pain, or bleeding from the bowel. Do not take without your caregiver's approval if you have problems with fluid retention, heart failure, or kidney problems. °· For ongoing knee problems, physical therapy may be helpful. °· Glucosamine and chondroitin are over-the-counter dietary supplements. Both may help relieve the pain of osteoarthritis in the knee. These medicines are different from the usual anti-inflammatory drugs. Glucosamine may decrease the rate of cartilage destruction. °· Injections of a corticosteroid drug into your knee joint may help reduce the symptoms of an arthritis flare-up. They may provide pain relief that lasts a few months. You may have to wait a few months between injections. The injections do have a small increased risk of infection, water retention, and elevated blood sugar levels. °· Hyaluronic acid injected into damaged joints may ease pain and provide lubrication. These injections may work by reducing inflammation. A series of shots may give relief for as long as 6 months. °· Topical painkillers. Applying certain ointments to your skin may help relieve the pain and stiffness of osteoarthritis. Ask your pharmacist for suggestions. Many over the-counter products are approved for temporary relief of arthritis pain. °· In some countries, doctors often prescribe topical NSAIDs for relief of chronic conditions such as arthritis and tendinitis. A review of treatment with NSAID creams found that they worked as well as oral medications but without the serious side effects. °PREVENTION °· Maintain a healthy weight. Extra pounds  put more strain on your joints. °· Get strong, stay limber. Weak muscles are a common cause of knee injuries. Stretching is important. Include flexibility exercises in your workouts. °· Be smart about exercise. If you have osteoarthritis, chronic knee pain or recurring injuries, you may need to change the way you exercise. This does not mean you have to stop being active. If your knees ache after jogging or playing basketball, consider switching to swimming, water aerobics, or other low-impact activities, at least for a few days a week. Sometimes limiting high-impact activities will provide relief. °· Make sure your shoes fit well. Choose footwear that is right for your sport. °· Protect your knees. Use the proper gear for knee-sensitive activities. Use kneepads when playing volleyball or laying carpet. Buckle your seat belt every time you drive. Most shattered kneecaps occur in car accidents. °· Rest when you are tired. °SEEK MEDICAL CARE IF:  °You have knee pain that is continual and does not seem to be getting better.  °SEEK IMMEDIATE MEDICAL CARE IF:  °Your knee joint feels hot to the touch and you have a high fever. °MAKE SURE YOU:  °· Understand these instructions. °· Will watch your condition. °· Will get help right away if you are not   doing well or get worse. °Document Released: 04/03/2007 Document Revised: 08/29/2011 Document Reviewed: 04/03/2007 °[employer]® Patient Information ©2015 [employer]. This information is not intended to replace advice given to you by your health care provider. Make sure you discuss any questions you have with your health care provider. ° °Knee Bracing °Knee braces are supports to help stabilize and protect an injured or painful knee. They come in many different styles. They should support and protect the knee without increasing the chance of other injuries to yourself or others. It is important not to have a false sense of security when using a brace. Knee braces that help you  to keep using your knee: °· Do not restore normal knee stability under high stress forces. °· May decrease some aspects of athletic performance. °Some of the different types of knee braces are: °· Prophylactic knee braces are designed to prevent or reduce the severity of knee injuries during sports that make injury to the knee more likely. °· Rehabilitative knee braces are designed to allow protected motion of: °¨ Injured knees. °¨ Knees that have been treated with or without surgery. °There is no evidence that the use of a supportive knee brace protects the graft following a successful anterior cruciate ligament (ACL) reconstruction. However, braces are sometimes used to:  °· Protect injured ligaments. °· Control knee movement during the initial healing period. °They may be used as part of the treatment program for the various injured ligaments or cartilage of the knee including the: °· Anterior cruciate ligament. °· Medial collateral ligament. °· Medial or lateral cartilage (meniscus). °· Posterior cruciate ligament. °· Lateral collateral ligament. °Rehabilitative knee braces are most commonly used: °· During crutch-assisted walking right after injury. °· During crutch-assisted walking right after surgery to repair the cartilage and/or cruciate ligament injury. °· For a short period of time, 2-8 weeks, after the injury or surgery. °The value of a rehabilitative brace as opposed to a cast or splint includes the: °· Ability to adjust the brace for swelling. °· Ability to remove the brace for examinations, icing, or showering. °· Ability to allow for movement in a controlled range of motion. °Functional knee braces give support to knees that have already been injured. They are designed to provide stability for the injured knee and provide protection after repair. Functional knee braces may not affect performance much. Lower extremity muscle strengthening, flexibility, and improvement in technique are more important  than bracing in treating ligamentous knee injuries. Functional braces are not a substitute for rehabilitation or surgical procedures. °Unloader/off-loader braces are designed to provide pain relief in arthritic knees. Patients with wear and tear arthritis from growing old or from an old cartilage injury (osteoarthritis) of the knee, and bowlegged (varus) or knock-knee (valgus) deformities, often develop increased pain in the arthritic side due to increased loading. Unloader/off-loader braces are made to reduce uneven loading in such knees. There is reduction in bowing out movement in bowlegged knees when the correct unloader brace is used. Patients with advanced osteoarthritis or severe varus or valgus alignment problems would not likely benefit from bracing. °Patellofemoral braces help the kneecap to move smoothly and well centered over the end of the femur in the knee.  °Most people who wear knee braces feel that they help. However, there is a lack of scientific evidence that knee braces are helpful at the level needed for athletic participation to prevent injury. In spite of this, athletes report an increase in knee stability, pain relief, performance improvement, and confidence   during athletics when using a brace.  °Different knee problems require different knee braces: °· Your caregiver may suggest one kind of knee brace after knee surgery. °· A caregiver may choose another kind of knee brace for support instead of surgery for some types of torn ligaments. °· You may also need one for pain in the front of your knee that is not getting better with strengthening and flexibility exercises. °Get your caregiver's advice if you want to try a knee brace. The caregiver will advise you on where to get them and provide a prescription when it is needed to fashion and/or fit the brace. °Knee braces are the least important part of preventing knee injuries or getting better following injury. Stretching, strengthening and  technique improvement are far more important in caring for and preventing knee injuries. When strengthening your knee, increase your activities a little at a time so as not to develop injuries from overuse. Work out an exercise plan with your caregiver and/or physical therapist to get the best program for you. Do not let a knee brace become a crutch. °Always remember, there are no braces which support the knee as well as your original ligaments and cartilage you were born with. Conditioning, proper warm-up, and stretching remain the most important parts of keeping your knees healthy. °HOW TO USE A KNEE BRACE °· During sports, knee braces should be used as directed by your caregiver. °· Make sure that the hinges are where the knee bends. °· Straps, tapes, or hook-and-loop tapes should be fastened around your leg as instructed. °· You should check the placement of the brace during activities to make sure that it has not moved. Poorly positioned braces can hurt rather than help you. °· To work well, a knee brace should be worn during all activities that put you at risk of knee injury. °· Warm up properly before beginning athletic activities. °HOME CARE INSTRUCTIONS °· Knee braces often get damaged during normal use. Replace worn-out braces for maximum benefit. °· Clean regularly with soap and water. °· Inspect your brace often for wear and tear. °· Cover exposed metal to protect others from injury. °· Durable materials may cost more, but last longer. °SEEK IMMEDIATE MEDICAL CARE IF:  °· Your knee seems to be getting worse rather than better. °· You have increasing pain or swelling in the knee. °· You have problems caused by the knee brace. °· You have increased swelling or inflammation (redness or soreness) in your knee. °· Your knee becomes warm and more painful and you develop an unexplained temperature over 101°F (38.3°C). °MAKE SURE YOU:  °· Understand these instructions. °· Will watch your condition. °· Will get  help right away if you are not doing well or get worse. °See your caregiver, physical therapist, or orthopedic surgeon for additional information. °Document Released: 08/27/2003 Document Revised: 10/21/2013 Document Reviewed: 12/03/2008 °ExitCare® Patient Information ©2015 [employer]. This information is not intended to replace advice given to you by your health care provider. Make sure you discuss any questions you have with your health care provider. ° °
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
## 2014-06-16 ENCOUNTER — Emergency Department (HOSPITAL_COMMUNITY)
Admission: EM | Admit: 2014-06-16 | Discharge: 2014-06-16 | Discharge status: Against Medical Advice | Payer: Medicaid Other | Attending: Emergency Medicine | Admitting: Emergency Medicine

## 2014-06-16 ENCOUNTER — Encounter (HOSPITAL_COMMUNITY): Payer: Self-pay | Admitting: Emergency Medicine

## 2014-06-16 ENCOUNTER — Ambulatory Visit (HOSPITAL_COMMUNITY): Admission: RE | Admit: 2014-06-16 | Payer: No Typology Code available for payment source | Source: Ambulatory Visit

## 2014-06-16 DIAGNOSIS — Z720 Tobacco use: Secondary | ICD-10-CM | POA: Diagnosis not present

## 2014-06-16 DIAGNOSIS — I252 Old myocardial infarction: Secondary | ICD-10-CM | POA: Diagnosis not present

## 2014-06-16 DIAGNOSIS — M25562 Pain in left knee: Secondary | ICD-10-CM | POA: Insufficient documentation

## 2014-06-16 NOTE — ED Notes (Signed)
Per registration, pt had medical emergency with son at home.  Had to leave.  He notified Registration of circumstances of departure.

## 2014-06-16 NOTE — ED Notes (Signed)
Per pt, states left knee injury/pain since 9/30-has not followed up with ortho

## 2014-06-27 ENCOUNTER — Other Ambulatory Visit: Payer: Self-pay | Admitting: Orthopedic Surgery

## 2014-06-27 DIAGNOSIS — M25562 Pain in left knee: Secondary | ICD-10-CM

## 2014-07-03 ENCOUNTER — Other Ambulatory Visit: Payer: Medicaid Other

## 2014-08-10 ENCOUNTER — Encounter (HOSPITAL_COMMUNITY): Payer: Self-pay

## 2014-08-10 ENCOUNTER — Emergency Department (HOSPITAL_COMMUNITY)
Admission: EM | Admit: 2014-08-10 | Discharge: 2014-08-10 | Disposition: A | Discharge status: Home / Self Care | Payer: Medicaid Other | Attending: Emergency Medicine | Admitting: Emergency Medicine

## 2014-08-10 ENCOUNTER — Emergency Department (HOSPITAL_COMMUNITY): Payer: Medicaid Other

## 2014-08-10 DIAGNOSIS — Y92410 Unspecified street and highway as the place of occurrence of the external cause: Secondary | ICD-10-CM | POA: Insufficient documentation

## 2014-08-10 DIAGNOSIS — Y9389 Activity, other specified: Secondary | ICD-10-CM | POA: Insufficient documentation

## 2014-08-10 DIAGNOSIS — Z8679 Personal history of other diseases of the circulatory system: Secondary | ICD-10-CM | POA: Diagnosis not present

## 2014-08-10 DIAGNOSIS — S4992XA Unspecified injury of left shoulder and upper arm, initial encounter: Secondary | ICD-10-CM | POA: Diagnosis not present

## 2014-08-10 DIAGNOSIS — Z720 Tobacco use: Secondary | ICD-10-CM | POA: Insufficient documentation

## 2014-08-10 DIAGNOSIS — Y998 Other external cause status: Secondary | ICD-10-CM | POA: Diagnosis not present

## 2014-08-10 DIAGNOSIS — M25512 Pain in left shoulder: Secondary | ICD-10-CM

## 2014-08-10 MED ORDER — HYDROCODONE-ACETAMINOPHEN 5-325 MG PO TABS: 1.0000 | ORAL_TABLET | ORAL | Status: DC | PRN

## 2014-08-10 MED ORDER — METHOCARBAMOL 500 MG PO TABS: 500.0000 mg | ORAL_TABLET | Freq: Two times a day (BID) | ORAL | Status: DC
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
## 2014-08-10 NOTE — ED Notes (Signed)
Pt escorted to discharge window. Pt verbalized understanding discharge instructions. In no acute distress.  

## 2014-08-10 NOTE — ED Notes (Signed)
Pt wanted PA to come in to explain that he was not a drug seeker, PA and RN explained that providers must be cautious and screen everyone because of the prevalence of drug abuse. Pt asked how many pills he was getting for prescription, rn explained 6. Pt not happy with that amount, rn explained that was as many as pt could receive. Pt [place] and that 6 pills would only last him one day. rn encouraged pt to follow up with specialist.

## 2014-08-10 NOTE — ED Notes (Signed)
Pt alert and oriented x4. Respirations even and unlabored, bilateral symmetrical rise and fall of chest. Skin warm and dry. In no acute distress. Denies needs.   
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
## 2014-08-10 NOTE — Discharge Instructions (Signed)
Acromioclavicular Injuries °The AC (acromioclavicular) joint is the joint in the shoulder where the collarbone (clavicle) meets the shoulder blade (scapula). The part of the shoulder blade connected to the collarbone is called the acromion. Common problems with and treatments for the AC joint are detailed below. °ARTHRITIS °Arthritis occurs when the joint has been injured and the smooth padding between the joints (cartilage) is lost. This is the wear and tear seen in most joints of the body if they have been overused. This causes the joint to produce pain and swelling which is worse with activity.  °AC JOINT SEPARATION °AC joint separation means that the ligaments connecting the acromion of the shoulder blade and collarbone have been damaged, and the two bones no longer line up. AC separations can be anywhere from mild to severe, and are "graded" depending upon which ligaments are torn and how badly they are torn. °· Grade I Injury: the least damage is done, and the AC joint still lines up. °· Grade II Injury: damage to the ligaments which reinforce the AC joint. In a Grade II injury, these ligaments are stretched but not entirely torn. When stressed, the AC joint becomes painful and unstable. °· Grade III Injury: AC and secondary ligaments are completely torn, and the collarbone is no longer attached to the shoulder blade. This results in deformity; a prominence of the end of the clavicle. °AC JOINT FRACTURE °AC joint fracture means that there has been a break in the bones of the AC joint, usually the end of the clavicle. °TREATMENT °TREATMENT OF AC ARTHRITIS °· There is currently no way to replace the cartilage damaged by arthritis. The best way to improve the condition is to decrease the activities which aggravate the problem. Application of ice to the joint helps decrease pain and soreness (inflammation). The use of non-steroidal anti-inflammatory medication is helpful. °· If less conservative measures do not  work, then cortisone shots (injections) may be used. These are anti-inflammatories; they decrease the soreness in the joint and swelling. °· If non-surgical measures fail, surgery may be recommended. The procedure is generally removal of a portion of the end of the clavicle. This is the part of the collarbone closest to your acromion which is stabilized with ligaments to the acromion of the shoulder blade. This surgery may be performed using a tube-like instrument with a light (arthroscope) for looking into a joint. It may also be performed as an open surgery through a small incision by the surgeon. Most patients will have good range of motion within 6 weeks and may return to all activity including sports by 8-12 weeks, barring complications. °TREATMENT OF AN AC SEPARATION °· The initial treatment is to decrease pain. This is best accomplished by immobilizing the arm in a sling and placing an ice pack to the shoulder for 20 to 30 minutes every 2 hours as needed. As the pain starts to subside, it is important to begin moving the fingers, wrist, elbow and eventually the shoulder in order to prevent a stiff or "frozen" shoulder. Instruction on when and how much to move the shoulder will be provided by your caregiver. The length of time needed to regain full motion and function depends on the amount or grade of the injury. Recovery from a Grade I AC separation usually takes 10 to 14 days, whereas a Grade III may take 6 to 8 weeks. °· Grade I and II separations usually do not require surgery. Even Grade III injuries usually allow return to full   activity with few restrictions. Treatment is also based on the activity demands of the injured shoulder. For example, a high level quarterback with an injured throwing arm will receive more aggressive treatment than someone with a desk job who rarely uses his/her arm for strenuous activities. In some cases, a painful lump may persist which could require a later surgery. Surgery  can be very successful, but the benefits must be weighed against the potential risks. °TREATMENT OF AN AC JOINT FRACTURE °Fracture treatment depends on the type of fracture. Sometimes a splint or sling may be all that is required. Other times surgery may be required for repair. This is more frequently the case when the ligaments supporting the clavicle are completely torn. Your caregiver will help you with these decisions and together you can decide what will be the best treatment. °HOME CARE INSTRUCTIONS  °· Apply ice to the injury for 15-20 minutes each hour while awake for 2 days. Put the ice in a plastic bag and place a towel between the bag of ice and skin. °· If a sling has been applied, wear it constantly for as long as directed by your caregiver, even at night. The sling or splint can be removed for bathing or showering or as directed. Be sure to keep the shoulder in the same place as when the sling is on. Do not lift the arm. °· If a figure-of-eight splint has been applied it should be tightened gently by another person every day. Tighten it enough to keep the shoulders held back. Allow enough room to place the index finger between the body and strap. Loosen the splint immediately if there is numbness or tingling in the hands. °· Take over-the-counter or prescription medicines for pain, discomfort or fever as directed by your caregiver. °· If you or your child has received a follow up appointment, it is very important to keep that appointment in order to avoid long term complications, chronic pain or disability. °SEEK MEDICAL CARE IF:  °· The pain is not relieved with medications. °· There is increased swelling or discoloration that continues to get worse rather than better. °· You or your child has been unable to follow up as instructed. °· There is progressive numbness and tingling in the arm, forearm or hand. °SEEK IMMEDIATE MEDICAL CARE IF:  °· The arm is numb, cold or pale. °· There is increasing pain  in the hand, forearm or fingers. °MAKE SURE YOU:  °· Understand these instructions. °· Will watch your condition. °· Will get help right away if you are not doing well or get worse. °Document Released: 03/16/2005 Document Revised: 08/29/2011 Document Reviewed: 09/08/2008 °[employer]® Patient Information ©2015 [employer]. This information is not intended to replace advice given to you by your health care provider. Make sure you discuss any questions you have with your health care provider. ° °

## 2014-09-01 ENCOUNTER — Other Ambulatory Visit: Payer: Self-pay | Admitting: Orthopaedic Surgery

## 2014-09-01 DIAGNOSIS — M25512 Pain in left shoulder: Secondary | ICD-10-CM

## 2014-09-07 ENCOUNTER — Other Ambulatory Visit: Payer: Medicaid Other

## 2014-09-13 ENCOUNTER — Other Ambulatory Visit: Payer: Medicaid Other

## 2014-10-23 ENCOUNTER — Ambulatory Visit
Admission: RE | Admit: 2014-10-23 | Discharge: 2014-10-23 | Disposition: A | Discharge status: Home / Self Care | Payer: Medicaid Other | Source: Ambulatory Visit | Attending: Orthopaedic Surgery | Admitting: Orthopaedic Surgery

## 2014-10-23 DIAGNOSIS — M25512 Pain in left shoulder: Secondary | ICD-10-CM

## 2014-12-05 ENCOUNTER — Emergency Department (HOSPITAL_COMMUNITY): Payer: Medicaid Other

## 2014-12-05 ENCOUNTER — Emergency Department (HOSPITAL_COMMUNITY)
Admission: EM | Admit: 2014-12-05 | Discharge: 2014-12-05 | Disposition: A | Discharge status: Home / Self Care | Payer: Medicaid Other | Attending: Emergency Medicine | Admitting: Emergency Medicine

## 2014-12-05 ENCOUNTER — Encounter (HOSPITAL_COMMUNITY): Payer: Self-pay

## 2014-12-05 DIAGNOSIS — Z720 Tobacco use: Secondary | ICD-10-CM | POA: Diagnosis not present

## 2014-12-05 DIAGNOSIS — Y998 Other external cause status: Secondary | ICD-10-CM | POA: Insufficient documentation

## 2014-12-05 DIAGNOSIS — Z79899 Other long term (current) drug therapy: Secondary | ICD-10-CM | POA: Diagnosis not present

## 2014-12-05 DIAGNOSIS — Y9389 Activity, other specified: Secondary | ICD-10-CM | POA: Diagnosis not present

## 2014-12-05 DIAGNOSIS — Y9289 Other specified places as the place of occurrence of the external cause: Secondary | ICD-10-CM | POA: Diagnosis not present

## 2014-12-05 DIAGNOSIS — S61411A Laceration without foreign body of right hand, initial encounter: Secondary | ICD-10-CM | POA: Insufficient documentation

## 2014-12-05 DIAGNOSIS — I252 Old myocardial infarction: Secondary | ICD-10-CM | POA: Diagnosis not present

## 2014-12-05 DIAGNOSIS — W1789XA Other fall from one level to another, initial encounter: Secondary | ICD-10-CM | POA: Insufficient documentation

## 2014-12-05 MED ORDER — HYDROCODONE-ACETAMINOPHEN 5-325 MG PO TABS
2.0000 | ORAL_TABLET | Freq: Once | ORAL | Status: AC
  Administered 2014-12-05: 2 via ORAL
  Filled 2014-12-05: qty 2

## 2014-12-05 MED ORDER — BACITRACIN ZINC 500 UNIT/GM EX OINT: 1.0000 "application " | TOPICAL_OINTMENT | Freq: Two times a day (BID) | CUTANEOUS | Status: DC

## 2014-12-05 MED ORDER — LIDOCAINE-EPINEPHRINE 2 %-1:100000 IJ SOLN
20.0000 mL | Freq: Once | INTRAMUSCULAR | Status: DC
  Filled 2014-12-05: qty 1

## 2014-12-05 MED ORDER — HYDROCODONE-ACETAMINOPHEN 5-325 MG PO TABS: 1.0000 | ORAL_TABLET | ORAL | Status: DC | PRN

## 2014-12-05 NOTE — ED Notes (Signed)
Patient went to car to get [employer] account planner.  Upon return dressing noted to be saturated with blood.  New dressing applied.  No further bleeding at present.

## 2014-12-05 NOTE — ED Provider Notes (Signed)
CSN: [id number]     Arrival date & time 12/05/14  0139 History   First MD Initiated Contact with Patient 12/05/14 0324     Chief Complaint  Patient presents with  . Extremity Laceration    (Consider location/radiation/quality/duration/timing/severity/associated sxs/prior Treatment) HPI Comments: 39 year old male with no significant past medical history presents to the emergency department for further evaluation of laceration to his right thenar eminence. Patient is right-hand dominant. He reports that he lost his balance while painting on a ladder, causing him to fall backwards with his hand going through a window. Patient reports bleeding at the scene which has been controlled with pressure in the emergency department. He reports pain with gripping as well as palpation to the area. Last tetanus was updated in 2014. Patient denies any numbness or weakness in his fingers of his right hand. No pallor.  The history is provided by the patient. No language interpreter was used.    Past Medical History  Diagnosis Date  . Heart attack     pt reports that all tests were negative for MI   Past Surgical History  Procedure Laterality Date  . Rotator cuff repair Right   . Finger surgery Left    Family History  Problem Relation Age of Onset  . Heart attack Other    History  Substance Use Topics  . Smoking status: Current Some Day Smoker -- 0.25 packs/day    Types: Cigarettes  . Smokeless tobacco: Not on file  . Alcohol Use: No    Review of Systems  Musculoskeletal: Positive for myalgias.  Skin: Positive for wound.  All other systems reviewed and are negative.   Allergies  Ibuprofen; Sulfa antibiotics; and Benadryl  Home Medications   Prior to Admission medications   Medication Sig Start Date End Date Taking? Authorizing Provider  acetaminophen (TYLENOL) 500 MG tablet Take 1,000 mg by mouth every 6 (six) hours as needed for mild pain or headache.    Historical Provider, MD    bacitracin ointment Apply 1 application topically 2 (two) times daily. 12/05/14   Antony Madura, PA-C  cyclobenzaprine (FLEXERIL) 10 MG tablet Take 1 tablet (10 mg total) by mouth 2 (two) times daily as needed for muscle spasms. Patient not taking: Reported on 08/10/2014 07/13/13   Arthor Captain, PA-C  HYDROcodone-acetaminophen (NORCO/VICODIN) 5-325 MG per tablet Take 1 tablet by mouth every 4 (four) hours as needed. 12/05/14   Antony Madura, PA-C  methocarbamol (ROBAXIN) 500 MG tablet Take 1 tablet (500 mg total) by mouth 2 (two) times daily. 08/10/14   Junius Finner, PA-C   BP 121/66 mmHg  Pulse 88  Temp(Src) 97.5 F (36.4 C) (Oral)  Resp 20  SpO2 98%   Physical Exam  Constitutional: He is oriented to person, place, and time. He appears well-developed and well-nourished. No distress.  Nontoxic/nonseptic appearing  HENT:  Head: Normocephalic and atraumatic.  Eyes: Conjunctivae and EOM are normal. No scleral icterus.  Neck: Normal range of motion.  Cardiovascular: Normal rate, regular rhythm and intact distal pulses.   Distal radial pulse 2+ in the right upper extremity. Capillary refill brisk in all digits of right hand.  Pulmonary/Chest: Effort normal. No respiratory distress.  Respirations even and unlabored  Musculoskeletal: Normal range of motion.       Right hand: He exhibits tenderness and laceration. He exhibits normal range of motion, no bony tenderness, normal capillary refill and no deformity. Normal sensation noted. Normal strength noted.       Hands: 0.5cm  laceration and 0.25cm laceration to the thenar eminence of the R hand. Bleeding controlled. No palpable, pulsatile bleeding.  Neurological: He is alert and oriented to person, place, and time. He exhibits normal muscle tone. Coordination normal.  Sensation to light touch intact in all digits. Grip strength 5/5 in the right hand.  Skin: Skin is warm and dry. No rash noted. He is not diaphoretic. No erythema. No pallor.   Psychiatric: He has a normal mood and affect. His behavior is normal.  Nursing note and vitals reviewed.   ED Course  Procedures (including critical care time) Labs Review Labs Reviewed - No data to display  Imaging Review Dg Wrist Complete Right  12/05/2014   CLINICAL DATA:  Patient fell and hand went for a window. Laceration to the radial side of the wrist.  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 There is no evidence of fracture or dislocation. There is no evidence of arthropathy or other focal bone abnormality. Soft tissues are unremarkable. No radiopaque soft tissue foreign bodies.  IMPRESSION: Negative.   Electronically Signed   By: Burman Nieves M.D.   On: 12/05/2014 04:26     EKG Interpretation None       LACERATION REPAIR Performed by: Antony Madura Authorized by: Antony Madura Consent: Verbal consent obtained. Risks and benefits: risks, benefits and alternatives were discussed Consent given by: patient Patient identity confirmed: provided demographic data Prepped and Draped in normal sterile fashion Wound explored  Laceration Location: R thenar eminence  Laceration Length: 0.5cm  No Foreign Bodies seen or palpated  Anesthesia: local infiltration  Local anesthetic: lidocaine 2% with epinephrine  Anesthetic total: 2 ml  Irrigation method: syringe Amount of cleaning: standard  Skin closure: 5-0 ethilon  Number of sutures: 5  Technique: simple interrupted  Patient tolerance: Patient tolerated the procedure well with no immediate complications.  MDM   Final diagnoses:  Laceration of hand, right, initial encounter    Tdap booster UTD. Patient neurovascularly intact. No FB palpated or visualized. No evidence of FB on imaging today. Laceration occurred < 8 hours prior to repair which was well tolerated. Pt has no comorbidities to effect normal wound  healing. Discussed suture home care with pt and answered questions. Pt to follow up for wound check and suture removal in 10-12 days. Pt is hemodynamically stable with no complaints prior to discharge. Wound dressed appropriately and return precautions discussed and provided. Patient agreeable to plan with no unaddressed concerns. Patient discharged in good condition.   Filed Vitals:   12/05/14 0142  BP: 121/66  Pulse: 88  Temp: 97.5 F (36.4 C)  TempSrc: Oral  Resp: 20  SpO2: 98%     Antony Madura, PA-C 12/05/14 0102  Marisa Severin, MD 12/05/14 [phone]

## 2014-12-05 NOTE — Discharge Instructions (Signed)
Keep your wound dressed to prevent infection. Apply bacitracin to the wound as prescribed. Change your dressing at least once per day. Follow-up with your primary care doctor for wound recheck and suture removal in 10-12 days.  Laceration Care, Adult A laceration is a cut or lesion that goes through all layers of the skin and into the tissue just beneath the skin. TREATMENT  Some lacerations may not require closure. Some lacerations may not be able to be closed due to an increased risk of infection. It is important to see your caregiver as soon as possible after an injury to minimize the risk of infection and maximize the opportunity for successful closure. If closure is appropriate, pain medicines may be given, if needed. The wound will be cleaned to help prevent infection. Your caregiver will use stitches (sutures), staples, wound glue (adhesive), or skin adhesive strips to repair the laceration. These tools bring the skin edges together to allow for faster healing and a better cosmetic outcome. However, all wounds will heal with a scar. Once the wound has healed, scarring can be minimized by covering the wound with sunscreen during the day for 1 full year. HOME CARE INSTRUCTIONS  For sutures or staples:  Keep the wound clean and dry.  If you were given a bandage (dressing), you should change it at least once a day. Also, change the dressing if it becomes wet or dirty, or as directed by your caregiver.  Wash the wound with soap and water 2 times a day. Rinse the wound off with water to remove all soap. Pat the wound dry with a clean towel.  After cleaning, apply a thin layer of the antibiotic ointment as recommended by your caregiver. This will help prevent infection and keep the dressing from sticking.  You may shower as usual after the first 24 hours. Do not soak the wound in water until the sutures are removed.  Only take over-the-counter or prescription medicines for pain, discomfort, or  fever as directed by your caregiver.  Get your sutures or staples removed as directed by your caregiver. For skin adhesive strips:  Keep the wound clean and dry.  Do not get the skin adhesive strips wet. You may bathe carefully, using caution to keep the wound dry.  If the wound gets wet, pat it dry with a clean towel.  Skin adhesive strips will fall off on their own. You may trim the strips as the wound heals. Do not remove skin adhesive strips that are still stuck to the wound. They will fall off in time. For wound adhesive:  You may briefly wet your wound in the shower or bath. Do not soak or scrub the wound. Do not swim. Avoid periods of heavy perspiration until the skin adhesive has fallen off on its own. After showering or bathing, gently pat the wound dry with a clean towel.  Do not apply liquid medicine, cream medicine, or ointment medicine to your wound while the skin adhesive is in place. This may loosen the film before your wound is healed.  If a dressing is placed over the wound, be careful not to apply tape directly over the skin adhesive. This may cause the adhesive to be pulled off before the wound is healed.  Avoid prolonged exposure to sunlight or tanning lamps while the skin adhesive is in place. Exposure to ultraviolet light in the first year will darken the scar.  The skin adhesive will usually remain in place for 5 to 10 days,  then naturally fall off the skin. Do not pick at the adhesive film. You may need a tetanus shot if:  You cannot remember when you had your last tetanus shot.  You have never had a tetanus shot. If you get a tetanus shot, your arm may swell, get red, and feel warm to the touch. This is common and not a problem. If you need a tetanus shot and you choose not to have one, there is a rare chance of getting tetanus. Sickness from tetanus can be serious. SEEK MEDICAL CARE IF:   You have redness, swelling, or increasing pain in the wound.  You see  a red line that goes away from the wound.  You have yellowish-white fluid (pus) coming from the wound.  You have a fever.  You notice a bad smell coming from the wound or dressing.  Your wound breaks open before or after sutures have been removed.  You notice something coming out of the wound such as wood or glass.  Your wound is on your hand or foot and you cannot move a finger or toe. SEEK IMMEDIATE MEDICAL CARE IF:   Your pain is not controlled with prescribed medicine.  You have severe swelling around the wound causing pain and numbness or a change in color in your arm, hand, leg, or foot.  Your wound splits open and starts bleeding.  You have worsening numbness, weakness, or loss of function of any joint around or beyond the wound.  You develop painful lumps near the wound or on the skin anywhere on your body. MAKE SURE YOU:   Understand these instructions.  Will watch your condition.  Will get help right away if you are not doing well or get worse. Document Released: 06/06/2005 Document Revised: 08/29/2011 Document Reviewed: 11/30/2010 [facility] Patient Information 2015 [place], [place]. This information is not intended to replace advice given to you by your health care provider. Make sure you discuss any questions you have with your health care provider.

## 2014-12-05 NOTE — ED Notes (Signed)
Pt presents with c/o extremity laceration. Pt lost his balance while painting and fell through a window. Pt has a deep laceration to his right wrist area, bleeding controlled at this time by pressure dressing. Pt reports last tetanus shot 2014.

## 2015-02-10 ENCOUNTER — Ambulatory Visit: Payer: Medicaid Other | Attending: Orthopedic Surgery | Admitting: Physical Therapy

## 2015-03-01 ENCOUNTER — Emergency Department (HOSPITAL_COMMUNITY): Payer: Medicaid Other

## 2015-03-01 ENCOUNTER — Emergency Department (HOSPITAL_COMMUNITY)
Admission: EM | Admit: 2015-03-01 | Discharge: 2015-03-01 | Disposition: A | Discharge status: Home / Self Care | Payer: Medicaid Other | Attending: Emergency Medicine | Admitting: Emergency Medicine

## 2015-03-01 ENCOUNTER — Encounter (HOSPITAL_COMMUNITY): Payer: Self-pay | Admitting: *Deleted

## 2015-03-01 DIAGNOSIS — S299XXA Unspecified injury of thorax, initial encounter: Secondary | ICD-10-CM | POA: Diagnosis not present

## 2015-03-01 DIAGNOSIS — W11XXXA Fall on and from ladder, initial encounter: Secondary | ICD-10-CM | POA: Diagnosis not present

## 2015-03-01 DIAGNOSIS — S199XXA Unspecified injury of neck, initial encounter: Secondary | ICD-10-CM | POA: Diagnosis not present

## 2015-03-01 DIAGNOSIS — Y998 Other external cause status: Secondary | ICD-10-CM | POA: Diagnosis not present

## 2015-03-01 DIAGNOSIS — Y9289 Other specified places as the place of occurrence of the external cause: Secondary | ICD-10-CM | POA: Diagnosis not present

## 2015-03-01 DIAGNOSIS — Z720 Tobacco use: Secondary | ICD-10-CM | POA: Diagnosis not present

## 2015-03-01 DIAGNOSIS — Y9389 Activity, other specified: Secondary | ICD-10-CM | POA: Diagnosis not present

## 2015-03-01 DIAGNOSIS — Z8674 Personal history of sudden cardiac arrest: Secondary | ICD-10-CM | POA: Diagnosis not present

## 2015-03-01 DIAGNOSIS — W19XXXA Unspecified fall, initial encounter: Secondary | ICD-10-CM

## 2015-03-01 MED ORDER — SODIUM CHLORIDE 0.9 % IV BOLUS (SEPSIS)
1000.0000 mL | Freq: Once | INTRAVENOUS | Status: AC
  Administered 2015-03-01: 1000 mL via INTRAVENOUS

## 2015-03-01 MED ORDER — HYDROMORPHONE HCL 1 MG/ML IJ SOLN
1.0000 mg | Freq: Once | INTRAMUSCULAR | Status: AC
  Administered 2015-03-01: 1 mg via INTRAVENOUS
  Filled 2015-03-01: qty 1

## 2015-03-01 MED ORDER — HYDROCODONE-ACETAMINOPHEN 5-325 MG PO TABS: 1.0000 | ORAL_TABLET | Freq: Four times a day (QID) | ORAL | Status: DC | PRN

## 2015-03-01 MED ORDER — MORPHINE SULFATE (PF) 4 MG/ML IV SOLN
8.0000 mg | Freq: Once | INTRAVENOUS | Status: AC
Start: 2015-03-01 — End: 2015-03-01
  Administered 2015-03-01: 8 mg via INTRAVENOUS
  Filled 2015-03-01: qty 2

## 2015-03-01 MED ORDER — METHOCARBAMOL 500 MG PO TABS: 500.0000 mg | ORAL_TABLET | Freq: Two times a day (BID) | ORAL | Status: DC

## 2015-03-01 NOTE — ED Provider Notes (Signed)
CSN: [id number]     Arrival date & time 03/01/15  1317 History   First MD Initiated Contact with Patient 03/01/15 1339     Chief Complaint  Patient presents with  . Fall  . Back Injury    HPI Patient presents with mechanical fall. Patient was well prior to falling from a ladder, approximately 12 feet. He complains of pain diffusely through his back, and his neck. No loss of function anywhere, no loss of sensation anywhere, no head pain, loss of consciousness. No medication taken for relief. No chest pain, belly pain, nausea, vomiting. Patient is generally well  Past Medical History  Diagnosis Date  . Heart attack     pt reports that all tests were negative for MI   Past Surgical History  Procedure Laterality Date  . Rotator cuff repair Right   . Finger surgery Left    Family History  Problem Relation Age of Onset  . Heart attack Other    Social History  Substance Use Topics  . Smoking status: Current Some Day Smoker -- 0.25 packs/day    Types: Cigarettes  . Smokeless tobacco: None  . Alcohol Use: No    Review of Systems  Constitutional: Negative for fever.  Respiratory: Negative for shortness of breath.   Cardiovascular: Negative for chest pain.  Musculoskeletal:       Negative aside from HPI  Skin:       Negative aside from HPI  Allergic/Immunologic: Negative for immunocompromised state.  Neurological: Negative for weakness and headaches.      Allergies  Ibuprofen; Sulfa antibiotics; and Benadryl  Home Medications   Prior to Admission medications   Medication Sig Start Date End Date Taking? Authorizing Provider  acetaminophen (TYLENOL) 500 MG tablet Take 1,000 mg by mouth every 6 (six) hours as needed for mild pain or headache.   Yes Historical Provider, MD  Multiple Vitamins-Minerals (MULTIVITAMIN ADULT PO) Take 1 tablet by mouth daily.   Yes Historical Provider, MD  bacitracin ointment Apply 1 application topically 2 (two) times daily. Patient not  taking: Reported on 03/01/2015 12/05/14   Antony Madura, PA-C  HYDROcodone-acetaminophen (NORCO/VICODIN) 5-325 MG per tablet Take 1 tablet by mouth every 6 (six) hours as needed for severe pain. 03/01/15   Gerhard Munch, MD  methocarbamol (ROBAXIN) 500 MG tablet Take 1 tablet (500 mg total) by mouth 2 (two) times daily. 03/01/15   Gerhard Munch, MD   BP 119/69 mmHg  Pulse 71  Temp(Src) 98.6 F (37 C) (Oral)  Resp 16  SpO2 100% Physical Exam  Constitutional: He is oriented to person, place, and time. He appears well-developed.  HENT:  Head: Normocephalic and atraumatic.  Eyes: Conjunctivae and EOM are normal.  Neck:  Cervical collar in place, but when removed after CT scans resulted, patient was moving the neck freely, no deformity, no crepitus  Cardiovascular: Normal rate and regular rhythm.   Pulmonary/Chest: Effort normal. No stridor. No respiratory distress.  Abdominal: He exhibits no distension.  Musculoskeletal: He exhibits no edema.       Arms: Neurological: He is alert and oriented to person, place, and time. He displays no atrophy and no tremor. No cranial nerve deficit or sensory deficit. He exhibits normal muscle tone. He displays no seizure activity. Coordination normal.  Skin: Skin is warm. He is diaphoretic.  Psychiatric: He has a normal mood and affect.  Nursing note and vitals reviewed.   ED Course  Procedures (including critical care time) Labs Review Labs Reviewed -  No data to display  Imaging Review Dg Thoracic Spine 2 View  03/01/2015   CLINICAL DATA:  Fall from ladder with upper back pain  EXAM: THORACIC SPINE 2 VIEWS  COMPARISON:  None.  FINDINGS: There is no evidence of thoracic spine fracture. Alignment is normal. No other significant bone abnormalities are identified.  IMPRESSION: No acute abnormality noted.   Electronically Signed   By: Alcide Clever M.D.   On: 03/01/2015 14:50   Dg Lumbar Spine Complete  03/01/2015   CLINICAL DATA:  Acute lower back pain  after fall from 18 foot ladder.  EXAM: LUMBAR SPINE - COMPLETE 4+ VIEW  COMPARISON:  January 14, 2014.  FINDINGS: There is no evidence of lumbar spine fracture. Alignment is normal. Intervertebral disc spaces are maintained.  IMPRESSION: Normal lumbar spine.   Electronically Signed   By: Lupita Raider, M.D.   On: 03/01/2015 14:51   Ct Cervical Spine Wo Contrast  03/01/2015   CLINICAL DATA:  Fall from 12 foot ladder, neck pain  EXAM: CT CERVICAL SPINE WITHOUT CONTRAST  TECHNIQUE: Multidetector CT imaging of the cervical spine was performed without intravenous contrast. Multiplanar CT image reconstructions were also generated.  COMPARISON:  11/18/2009  FINDINGS: C1 through the cervicothoracic junction is visualized in its entirety. Normal alignment. No precervical soft tissue widening. No fracture or dislocation. Emphysematous changes are noted at the lung apices.  IMPRESSION: No fracture or dislocation.  Severe emphysematous changes at the lung apices for the patient's age.   Electronically Signed   By: Christiana Pellant M.D.   On: 03/01/2015 15:26   Dg Shoulder Left  03/01/2015   CLINICAL DATA:  Acute left shoulder pain after fall from 18 foot ladder.  EXAM: LEFT SHOULDER - 2+ VIEW  COMPARISON:  None.  FINDINGS: There is no evidence of fracture or dislocation. There is no evidence of arthropathy or other focal bone abnormality. Soft tissues are unremarkable.  IMPRESSION: Normal left shoulder.   Electronically Signed   By: Lupita Raider, M.D.   On: 03/01/2015 14:53   I have personally reviewed and evaluated these images and lab results as part of my medical decision-making.   4:06 PM Patient hemodynamically stable, no new complaints. We discussed all findings, the need for home medication use, monitoring, cryotherapy.  MDM   Final diagnoses:  Fall, initial encounter   He presents after falling off a ladder, approximately 12 feet. Here the patient is awake, alert, moving all extremity  spontaneously, in no distress, hemodynamically stable. Given the fall, his neck pain, radiographic studies were obtained. These were reassuring.   Gerhard Munch, MD 03/01/15 [phone]

## 2015-03-01 NOTE — Discharge Instructions (Signed)
As discussed, it is normal to feel worse in the days immediately following a fall regardless of medication use. ° °However, please take all medication as directed, use ice packs liberally.  If you develop any new, or concerning changes in your condition, please return here for further evaluation and management.   ° °Otherwise, please return followup with your physician ° ° ° °

## 2015-03-01 NOTE — ED Notes (Signed)
Patient transported to CT 

## 2015-03-01 NOTE — ED Notes (Signed)
Pt reports fall from a 12 foot ladder today while painting.  Landed on his back.  Pt ambulatory

## 2015-05-20 ENCOUNTER — Other Ambulatory Visit: Payer: Self-pay | Admitting: Pain Medicine

## 2015-05-20 DIAGNOSIS — M545 Low back pain: Secondary | ICD-10-CM

## 2015-06-03 ENCOUNTER — Ambulatory Visit
Admission: RE | Admit: 2015-06-03 | Discharge: 2015-06-03 | Disposition: A | Discharge status: Home / Self Care | Payer: Medicaid Other | Source: Ambulatory Visit | Attending: Pain Medicine | Admitting: Pain Medicine

## 2015-06-03 DIAGNOSIS — M545 Low back pain: Secondary | ICD-10-CM

## 2015-06-15 ENCOUNTER — Emergency Department (HOSPITAL_COMMUNITY)
Admission: EM | Admit: 2015-06-15 | Discharge: 2015-06-15 | Disposition: A | Discharge status: Home / Self Care | Payer: Medicaid Other | Attending: Emergency Medicine | Admitting: Emergency Medicine

## 2015-06-15 ENCOUNTER — Encounter (HOSPITAL_COMMUNITY): Payer: Self-pay | Admitting: *Deleted

## 2015-06-15 ENCOUNTER — Emergency Department (HOSPITAL_COMMUNITY): Payer: Medicaid Other

## 2015-06-15 DIAGNOSIS — Z79899 Other long term (current) drug therapy: Secondary | ICD-10-CM | POA: Diagnosis not present

## 2015-06-15 DIAGNOSIS — S4992XA Unspecified injury of left shoulder and upper arm, initial encounter: Secondary | ICD-10-CM | POA: Insufficient documentation

## 2015-06-15 DIAGNOSIS — M25512 Pain in left shoulder: Secondary | ICD-10-CM

## 2015-06-15 DIAGNOSIS — F17210 Nicotine dependence, cigarettes, uncomplicated: Secondary | ICD-10-CM | POA: Diagnosis not present

## 2015-06-15 DIAGNOSIS — I252 Old myocardial infarction: Secondary | ICD-10-CM | POA: Insufficient documentation

## 2015-06-15 DIAGNOSIS — Y9389 Activity, other specified: Secondary | ICD-10-CM | POA: Diagnosis not present

## 2015-06-15 DIAGNOSIS — Y998 Other external cause status: Secondary | ICD-10-CM | POA: Insufficient documentation

## 2015-06-15 DIAGNOSIS — Y92410 Unspecified street and highway as the place of occurrence of the external cause: Secondary | ICD-10-CM | POA: Insufficient documentation

## 2015-06-15 MED ORDER — METHOCARBAMOL 500 MG PO TABS
1000.0000 mg | ORAL_TABLET | Freq: Once | ORAL | Status: AC
  Administered 2015-06-15: 1000 mg via ORAL
  Filled 2015-06-15: qty 2

## 2015-06-15 MED ORDER — OXYCODONE-ACETAMINOPHEN 5-325 MG PO TABS: 2.0000 | ORAL_TABLET | ORAL | Status: DC | PRN

## 2015-06-15 MED ORDER — OXYCODONE-ACETAMINOPHEN 5-325 MG PO TABS
2.0000 | ORAL_TABLET | Freq: Once | ORAL | Status: AC
  Administered 2015-06-15: 2 via ORAL
  Filled 2015-06-15: qty 2

## 2015-06-15 MED ORDER — METHOCARBAMOL 500 MG PO TABS: 500.0000 mg | ORAL_TABLET | Freq: Two times a day (BID) | ORAL | Status: DC
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
## 2015-06-15 NOTE — Discharge Instructions (Signed)
You have been seen today for shoulder pain. Your imaging showed no abnormalities. Follow-up Dr. Magnus IvanBlackman on this issue as soon as possible. Follow up with PCP as needed. Return to ED should symptoms worsen.

## 2015-07-06 ENCOUNTER — Other Ambulatory Visit: Payer: Self-pay | Admitting: Orthopaedic Surgery

## 2015-07-06 DIAGNOSIS — M25512 Pain in left shoulder: Secondary | ICD-10-CM

## 2015-07-07 ENCOUNTER — Emergency Department (HOSPITAL_COMMUNITY)
Admission: EM | Admit: 2015-07-07 | Discharge: 2015-07-07 | Disposition: A | Discharge status: Home / Self Care | Payer: Medicaid Other | Attending: Physician Assistant | Admitting: Physician Assistant

## 2015-07-07 ENCOUNTER — Encounter (HOSPITAL_COMMUNITY): Payer: Self-pay | Admitting: Emergency Medicine

## 2015-07-07 DIAGNOSIS — M791 Myalgia: Secondary | ICD-10-CM | POA: Insufficient documentation

## 2015-07-07 DIAGNOSIS — R05 Cough: Secondary | ICD-10-CM | POA: Diagnosis present

## 2015-07-07 DIAGNOSIS — F17210 Nicotine dependence, cigarettes, uncomplicated: Secondary | ICD-10-CM | POA: Insufficient documentation

## 2015-07-07 DIAGNOSIS — R112 Nausea with vomiting, unspecified: Secondary | ICD-10-CM | POA: Diagnosis not present

## 2015-07-07 DIAGNOSIS — Z79899 Other long term (current) drug therapy: Secondary | ICD-10-CM | POA: Diagnosis not present

## 2015-07-07 DIAGNOSIS — I252 Old myocardial infarction: Secondary | ICD-10-CM | POA: Diagnosis not present

## 2015-07-07 DIAGNOSIS — R059 Cough, unspecified: Secondary | ICD-10-CM

## 2015-07-07 DIAGNOSIS — R6883 Chills (without fever): Secondary | ICD-10-CM | POA: Insufficient documentation

## 2015-07-07 DIAGNOSIS — R52 Pain, unspecified: Secondary | ICD-10-CM

## 2015-07-07 MED ORDER — BENZONATATE 100 MG PO CAPS: 100.0000 mg | ORAL_CAPSULE | Freq: Three times a day (TID) | ORAL | Status: DC

## 2015-07-07 MED ORDER — ONDANSETRON 4 MG PO TBDP
4.0000 mg | ORAL_TABLET | Freq: Once | ORAL | Status: AC | PRN
  Administered 2015-07-07: 4 mg via ORAL
  Filled 2015-07-07: qty 1

## 2015-07-07 MED ORDER — ONDANSETRON 4 MG PO TBDP: 4.0000 mg | ORAL_TABLET | Freq: Three times a day (TID) | ORAL | Status: DC | PRN

## 2015-07-07 MED ORDER — ACETAMINOPHEN 325 MG PO TABS
650.0000 mg | ORAL_TABLET | Freq: Once | ORAL | Status: AC
  Administered 2015-07-07: 650 mg via ORAL
  Filled 2015-07-07: qty 2

## 2015-07-07 NOTE — ED Notes (Signed)
Per EMS pt is c/o cough for 2 days and chills

## 2015-07-07 NOTE — ED Provider Notes (Signed)
CSN: [id number]     Arrival date & time 07/07/15  0219 History   First MD Initiated Contact with Patient 07/07/15 [phone]     Chief Complaint  Patient presents with  . flu like symptoms      (Consider location/radiation/quality/duration/timing/severity/associated sxs/prior Treatment) HPI   Jared Spencer is a 40 y.o. male, patient with no pertinent past medical history, presenting to the ED with non-productive cough, chills, body aches, and N/V for the last three days. Pt denies current pain. Pt has taken some Alka Seltzer Cold and Flu, with minimal relief. Pt denies shortness of breath, chest pain, sick contacts, abdominal pain, diarrhea, or any other complaints.   Past Medical History  Diagnosis Date  . Heart attack [facility])     pt reports that all tests were negative for MI   Past Surgical History  Procedure Laterality Date  . Rotator cuff repair Right   . Finger surgery Left    Family History  Problem Relation Age of Onset  . Heart attack Other    Social History  Substance Use Topics  . Smoking status: Current Some Day Smoker -- 0.25 packs/day    Types: Cigarettes  . Smokeless tobacco: None  . Alcohol Use: No    Review of Systems  Constitutional: Positive for chills. Negative for fever and diaphoresis.  Respiratory: Positive for cough. Negative for shortness of breath.   Cardiovascular: Negative for chest pain.  Gastrointestinal: Negative for nausea, vomiting, abdominal pain, diarrhea and constipation.  Genitourinary: Negative for dysuria.  Musculoskeletal: Positive for myalgias.  Skin: Negative for color change and pallor.  Neurological: Negative for dizziness, light-headedness and headaches.  All other systems reviewed and are negative.     Allergies  Ibuprofen; Sulfa antibiotics; and Benadryl  Home Medications   Prior to Admission medications   Medication Sig Start Date End Date Taking? Authorizing Provider  acetaminophen (TYLENOL) 500 MG tablet Take  1,000 mg by mouth every 6 (six) hours as needed for mild pain or headache.   Yes Historical Provider, MD  Multiple Vitamins-Minerals (MULTIVITAMIN ADULT PO) Take 1 tablet by mouth daily.   Yes Historical Provider, MD  benzonatate (TESSALON) 100 MG capsule Take 1 capsule (100 mg total) by mouth every 8 (eight) hours. 07/07/15   Kenae Lindquist C Zeynep Fantroy, PA-C  methocarbamol (ROBAXIN) 500 MG tablet Take 1 tablet (500 mg total) by mouth 2 (two) times daily. Patient taking differently: Take 500 mg by mouth 2 (two) times daily as needed for muscle spasms.  06/15/15   Alaynna Kerwood C Paddy Walthall, PA-C  ondansetron (ZOFRAN ODT) 4 MG disintegrating tablet Take 1 tablet (4 mg total) by mouth every 8 (eight) hours as needed for nausea or vomiting. 07/07/15   Mekala Winger C Adamarys Shall, PA-C   BP 103/78 mmHg  Pulse 92  Temp(Src) 97.8 F (36.6 C) (Oral)  Resp 14  SpO2 99% Physical Exam  Constitutional: He is oriented to person, place, and time. He appears well-developed and well-nourished. No distress.  HENT:  Head: Normocephalic and atraumatic.  Nose: Nose normal.  Mouth/Throat: Oropharynx is clear and moist.  Eyes: Conjunctivae are normal. Pupils are equal, round, and reactive to light.  Neck: Normal range of motion. Neck supple.  Cardiovascular: Normal rate, regular rhythm, normal heart sounds and intact distal pulses.   Pulmonary/Chest: Effort normal and breath sounds normal. No respiratory distress.  Abdominal: Soft. Bowel sounds are normal.  Musculoskeletal: He exhibits no edema or tenderness.  Lymphadenopathy:    He has no cervical adenopathy.  Neurological: He is alert and oriented to person, place, and time.  Skin: Skin is warm and dry. He is not diaphoretic.  Nursing note and vitals reviewed.   ED Course  Procedures (including critical care time)   MDM   Final diagnoses:  Cough  Non-intractable vomiting with nausea, vomiting of unspecified type  Body aches  Chills    Jared Spencer presents with nonproductive  cough, chills, nausea and vomiting, and body aches for the last 3 days.  This patient's presentation is consistent with viral illness such as influenza. Patient is nontoxic appearing, is afebrile, not tachycardic, not tachypneic, is normotensive, maintains SPO2 99% on room air, and is in no apparent distress. Patient was actually sleeping comfortably on the bed with my initial contact. Patient is afebrile with a non-productive cough, therefore imaging is not warranted at this time. Patient to receive fluid challenge, Tylenol, and Zofran. If fluid challenge is successful patient will be discharged. Fluid challenge successful. The patient was given instructions for home care as well as return precautions. Patient voices understanding of these instructions, accepts the plan, and is comfortable with discharge.  Anselm Pancoast, PA-C 07/07/15 0454  Courteney Randall An, MD 07/07/15 [phone]

## 2015-07-07 NOTE — ED Notes (Signed)
Pt states he tolerated his fluids fine.  Sitting up using the phone and appears to be comfortable.

## 2015-07-07 NOTE — ED Notes (Signed)
Called to take to treatment room  No response from lobby 

## 2015-07-07 NOTE — Discharge Instructions (Signed)
You have been seen today for cough, chills, body aches, nausea and vomiting. Your symptoms are consistent with possible viral illness, such as the flu. The treatment for a viral illness is supportive care. Get plenty of rest and drink plenty of fluids. Tessalon for cough. Tylenol for fever or pain. Zofran for nausea. Follow up with PCP as needed. Return to ED should symptoms worsen.

## 2015-07-07 NOTE — ED Notes (Signed)
Pt very lethargic during assessment  Pt states he has not taken any medication and denies drug use  Pt states he did not sleep very much last night and is just tired

## 2015-07-09 ENCOUNTER — Other Ambulatory Visit: Payer: Medicaid Other

## 2015-07-15 ENCOUNTER — Other Ambulatory Visit: Payer: Medicaid Other

## 2015-07-21 ENCOUNTER — Ambulatory Visit
Admission: RE | Admit: 2015-07-21 | Discharge: 2015-07-21 | Disposition: A | Discharge status: Home / Self Care | Payer: Medicaid Other | Source: Ambulatory Visit | Attending: Orthopaedic Surgery | Admitting: Orthopaedic Surgery

## 2015-07-21 DIAGNOSIS — M25512 Pain in left shoulder: Secondary | ICD-10-CM

## 2015-07-30 IMAGING — CR DG WRIST COMPLETE 3+V*R*
3 series · 3 of 3 positions shown · non-contrast
Comparison: Right hand 12/08/2009

CLINICAL DATA: Patient fell and hand went for a window. Laceration
to the radial side of the wrist.

EXAM:
RIGHT WRIST - COMPLETE 3+ VIEW

[x wrist pa right]
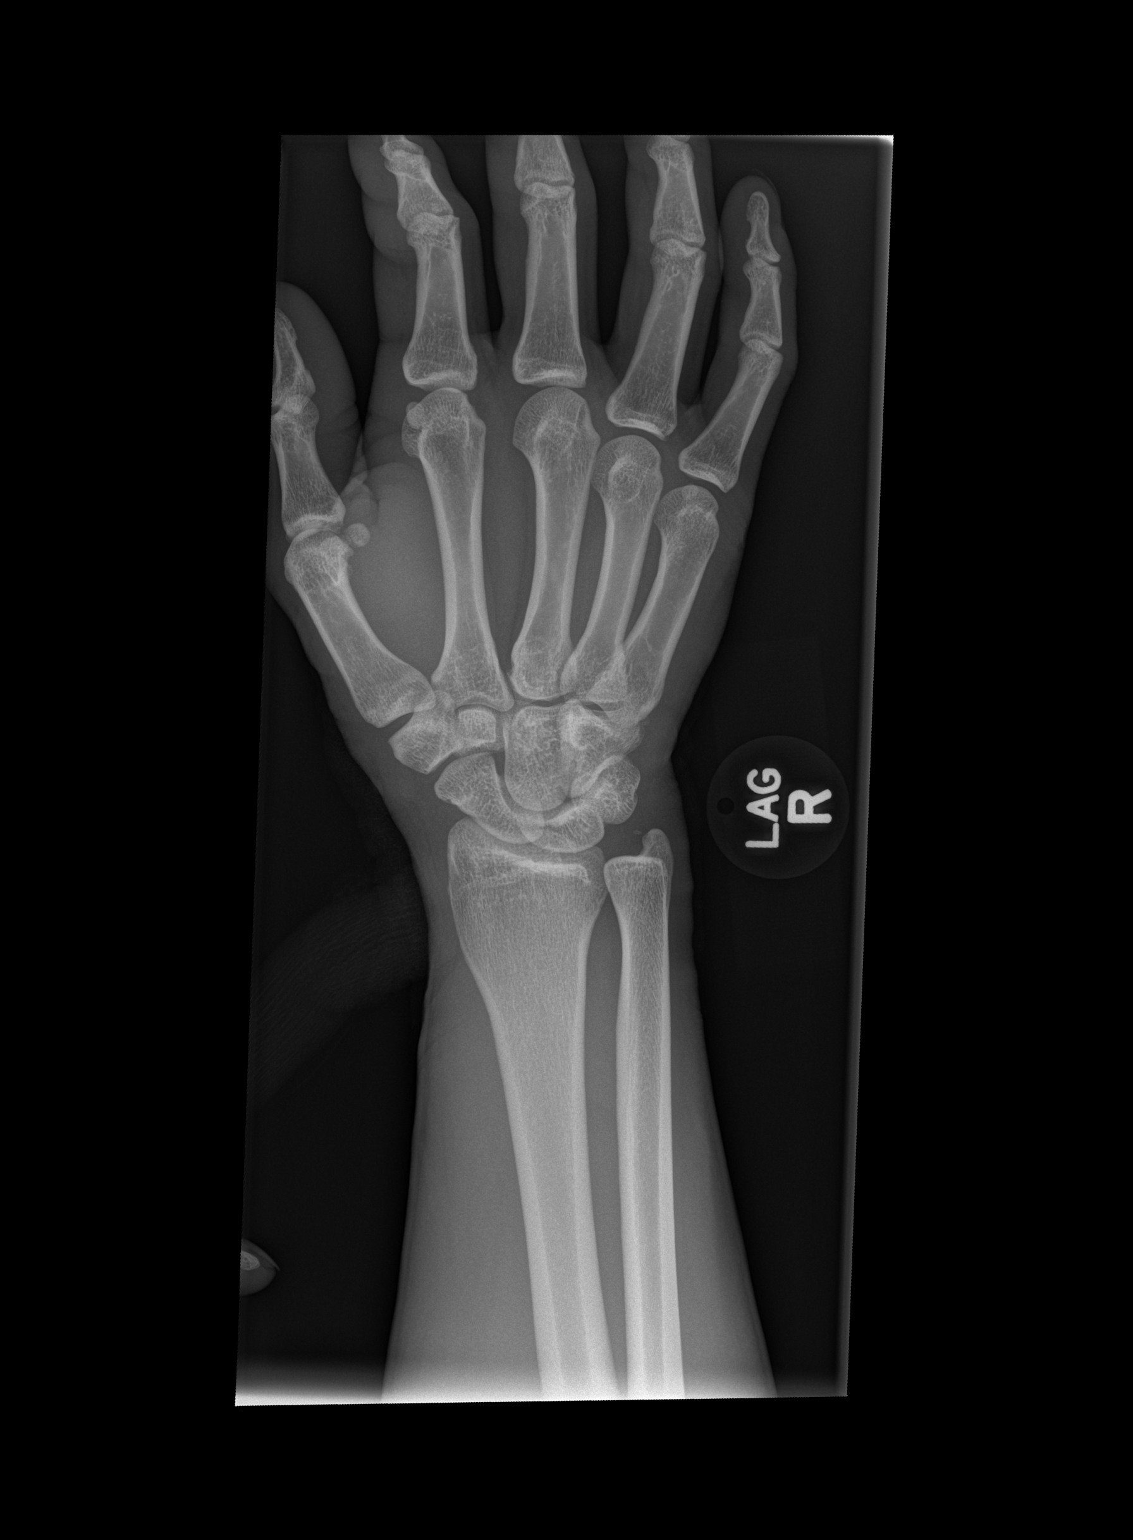

[x wrist obl right]
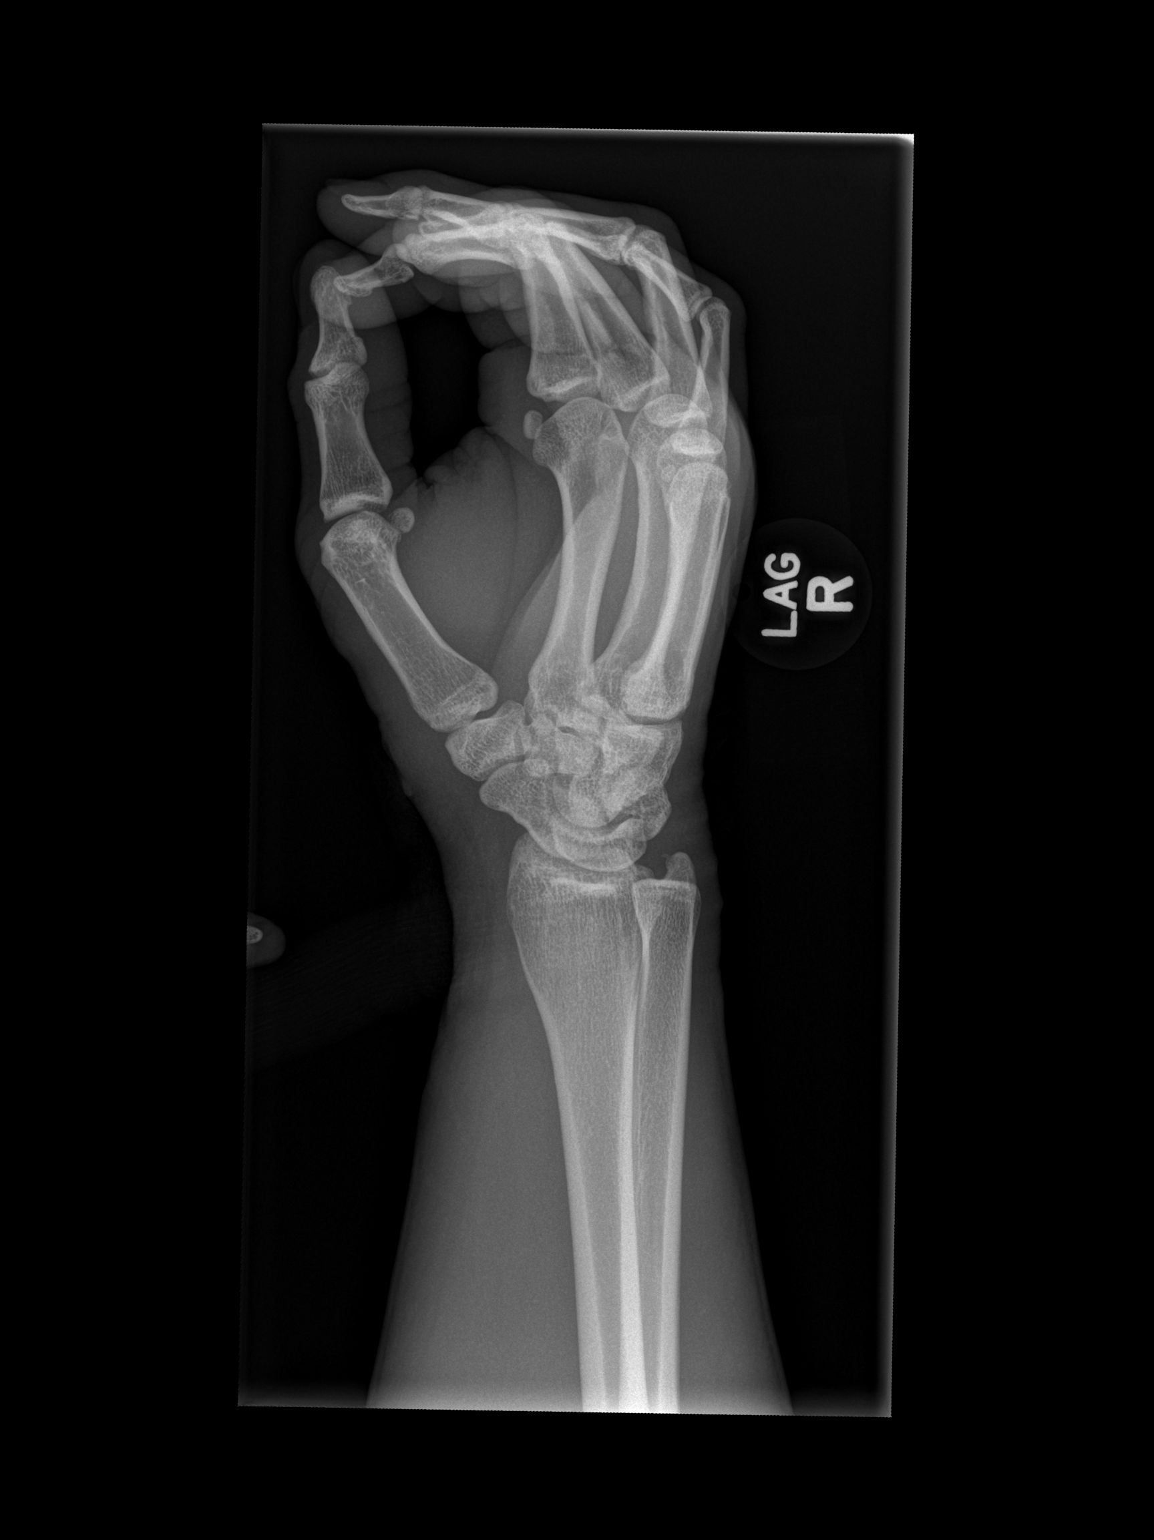

[x wrist lat right]
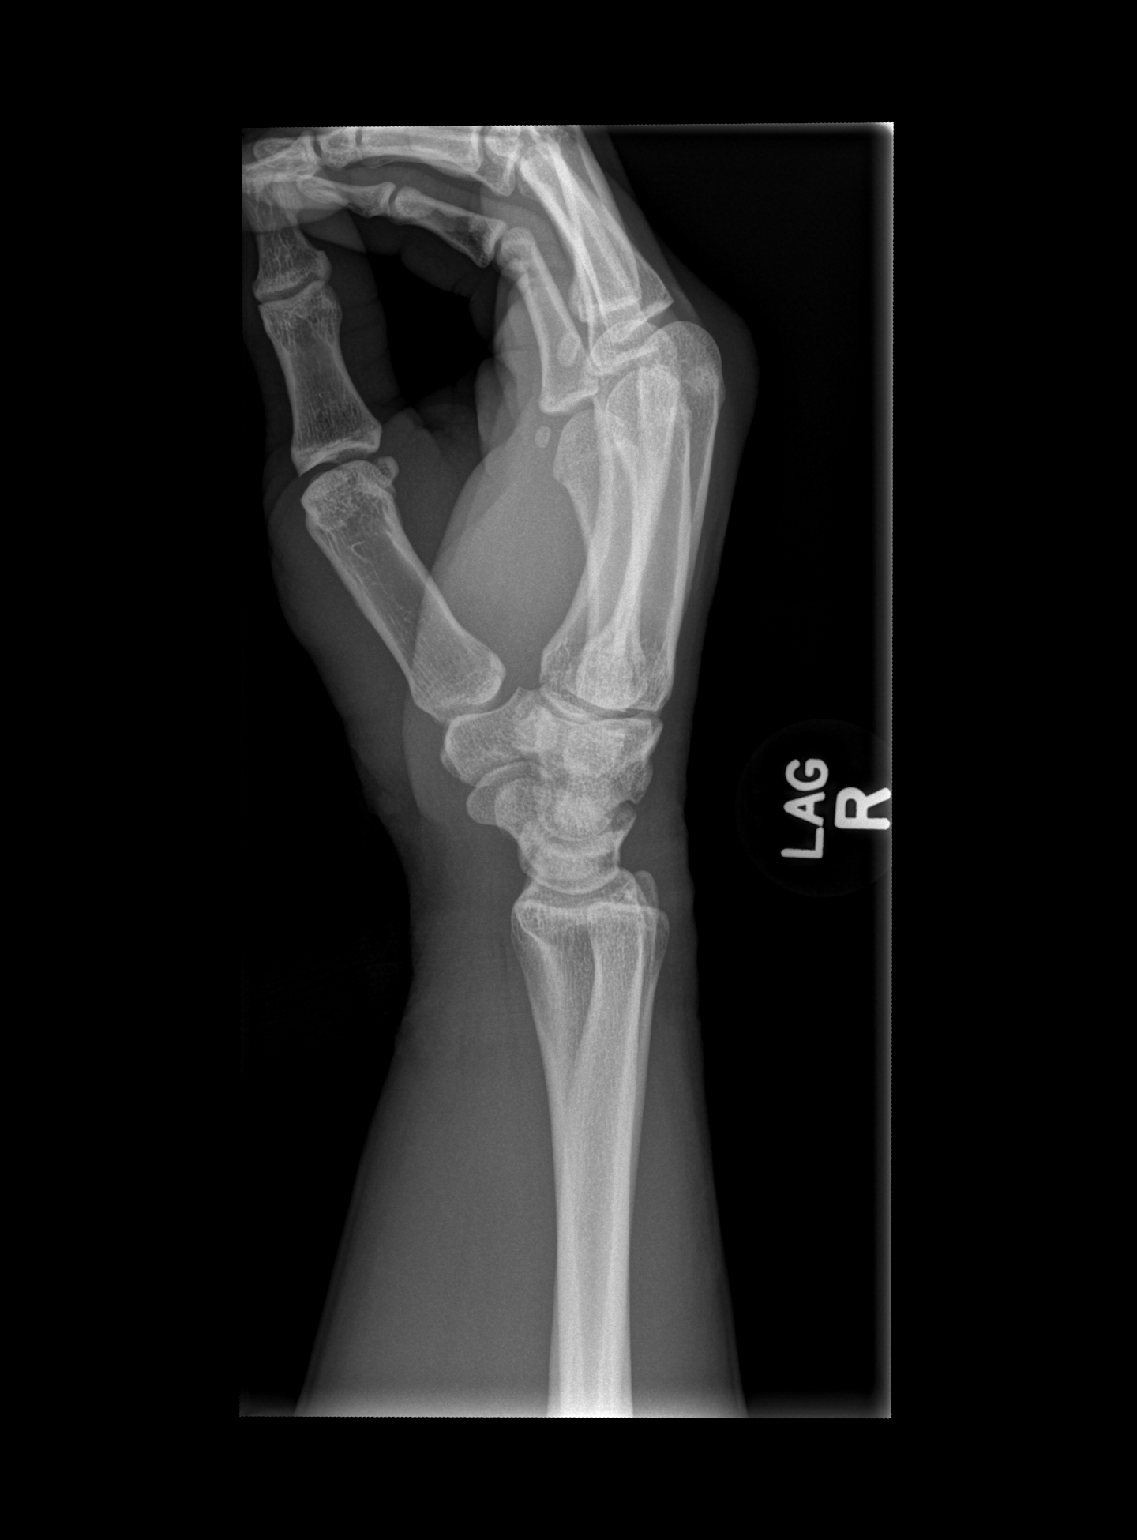

[3 of 3 positions shown; findings below may reference images not displayed]

FINDINGS: Old ununited ossicle over the right ulnar styloid process. There is
no evidence of fracture or dislocation. There is no evidence of
arthropathy or other focal bone abnormality. Soft tissues are
unremarkable. No radiopaque soft tissue foreign bodies.
IMPRESSION: Negative.

## 2015-11-22 ENCOUNTER — Encounter (HOSPITAL_COMMUNITY): Payer: Self-pay

## 2015-11-22 ENCOUNTER — Emergency Department (HOSPITAL_COMMUNITY): Payer: Medicaid Other

## 2015-11-22 ENCOUNTER — Emergency Department (HOSPITAL_COMMUNITY)
Admission: EM | Admit: 2015-11-22 | Discharge: 2015-11-22 | Disposition: A | Discharge status: Home / Self Care | Payer: Medicaid Other | Attending: Emergency Medicine | Admitting: Emergency Medicine

## 2015-11-22 DIAGNOSIS — Y929 Unspecified place or not applicable: Secondary | ICD-10-CM | POA: Diagnosis not present

## 2015-11-22 DIAGNOSIS — W11XXXA Fall on and from ladder, initial encounter: Secondary | ICD-10-CM | POA: Diagnosis not present

## 2015-11-22 DIAGNOSIS — Z79899 Other long term (current) drug therapy: Secondary | ICD-10-CM | POA: Diagnosis not present

## 2015-11-22 DIAGNOSIS — Y999 Unspecified external cause status: Secondary | ICD-10-CM | POA: Diagnosis not present

## 2015-11-22 DIAGNOSIS — F17210 Nicotine dependence, cigarettes, uncomplicated: Secondary | ICD-10-CM | POA: Diagnosis not present

## 2015-11-22 DIAGNOSIS — Y939 Activity, unspecified: Secondary | ICD-10-CM | POA: Diagnosis not present

## 2015-11-22 DIAGNOSIS — S4992XA Unspecified injury of left shoulder and upper arm, initial encounter: Secondary | ICD-10-CM | POA: Diagnosis present

## 2015-11-22 MED ORDER — METHOCARBAMOL 500 MG PO TABS: 500.0000 mg | ORAL_TABLET | Freq: Four times a day (QID) | ORAL | Status: DC | PRN

## 2015-11-22 MED ORDER — HYDROCODONE-ACETAMINOPHEN 5-325 MG PO TABS: 1.0000 | ORAL_TABLET | ORAL | Status: DC | PRN

## 2015-11-22 MED ORDER — METHOCARBAMOL 500 MG PO TABS
1000.0000 mg | ORAL_TABLET | Freq: Once | ORAL | Status: AC
  Administered 2015-11-22: 1000 mg via ORAL
  Filled 2015-11-22: qty 2

## 2015-11-22 MED ORDER — HYDROCODONE-ACETAMINOPHEN 5-325 MG PO TABS
1.0000 | ORAL_TABLET | Freq: Once | ORAL | Status: AC
  Administered 2015-11-22: 1 via ORAL
  Filled 2015-11-22: qty 1

## 2015-11-22 NOTE — Discharge Instructions (Signed)
Read the information below.  Use the prescribed medication as directed.  Please discuss all new medications with your pharmacist.  Do not take additional tylenol while taking the prescribed pain medication to avoid overdose.  You may return to the Emergency Department at any time for worsening condition or any new symptoms that concern you.    If you develop uncontrolled pain, weakness or numbness of the extremity, severe discoloration of the skin, or you are unable to use your arm, return to the ER for a recheck.    °

## 2015-11-22 NOTE — ED Provider Notes (Signed)
CSN: [id number]     Arrival date & time 11/22/15  1146 History  By signing my name below, I, [facility], attest that this documentation has been prepared under the direction and in the presence of [place], PA-C. Electronically Signed: Randell Patient, ED Scribe. 11/22/2015. 12:57 PM.   Chief Complaint  Patient presents with  . Fall  . Shoulder Pain    The history is provided by the patient. No language interpreter was used.   HPI Comments: Jared Spencer is a 40 y.o. male who presents to the Emergency Department complaining of constant, moderate left shoulder pain after a fall that occurred 11 hours ago. He describes the pain as if "someone is stabbing a dagger into his shoulder and twisting it." Pt states that he was inside and standing on top of a 8-foot ladder when he sneezed causing him to fall towards the left and stretched his left arm out behind him to catch himself and followed immediately by pain. Pain is worse with movement and palpation. He has not taken any medications or attempted any treatments. Per pt, he has been seen by orthopedist Dr. Magnus Ivan in the past who performed his rotator cuff repair on the right shoulder. Denies neck pain, back pain, left wrist pain, left elbow pain, left forearm pain, and left bicep pain.  Denies any other injury.  Did not hit head or lose consciousness.     Past Medical History  Diagnosis Date  . Heart attack [facility])     pt reports that all tests were negative for MI   Past Surgical History  Procedure Laterality Date  . Rotator cuff repair Right   . Finger surgery Left    Family History  Problem Relation Age of Onset  . Heart attack Other    Social History  Substance Use Topics  . Smoking status: Current Some Day Smoker -- 0.25 packs/day    Types: Cigarettes  . Smokeless tobacco: None  . Alcohol Use: No    Review of Systems  Constitutional: Negative for fever and chills.  HENT: Negative for facial swelling.    Respiratory: Negative for shortness of breath.   Cardiovascular: Negative for chest pain.  Gastrointestinal: Negative for abdominal pain.  Musculoskeletal: Positive for arthralgias. Negative for myalgias, back pain and neck pain.  Skin: Negative for color change and pallor.  Allergic/Immunologic: Negative for immunocompromised state.  Neurological: Negative for syncope, weakness, numbness and headaches.  Psychiatric/Behavioral: Negative for self-injury.      Allergies  Ibuprofen; Sulfa antibiotics; and Benadryl  Home Medications   Prior to Admission medications   Medication Sig Start Date End Date Taking? Authorizing Provider  acetaminophen (TYLENOL) 500 MG tablet Take 1,000 mg by mouth every 6 (six) hours as needed for mild pain or headache.    Historical Provider, MD  benzonatate (TESSALON) 100 MG capsule Take 1 capsule (100 mg total) by mouth every 8 (eight) hours. 07/07/15   Shawn C Joy, PA-C  methocarbamol (ROBAXIN) 500 MG tablet Take 1 tablet (500 mg total) by mouth 2 (two) times daily. Patient taking differently: Take 500 mg by mouth 2 (two) times daily as needed for muscle spasms.  06/15/15   Shawn C Joy, PA-C  Multiple Vitamins-Minerals (MULTIVITAMIN ADULT PO) Take 1 tablet by mouth daily.    Historical Provider, MD  ondansetron (ZOFRAN ODT) 4 MG disintegrating tablet Take 1 tablet (4 mg total) by mouth every 8 (eight) hours as needed for nausea or vomiting. 07/07/15   Shawn C  Joy, PA-C   BP 125/83 mmHg  Pulse 85  Temp(Src) 98.5 F (36.9 C) (Oral)  Resp 20  SpO2 100% Physical Exam  Constitutional: He appears well-developed and well-nourished. No distress.  HENT:  Head: Normocephalic and atraumatic.  Neck: Neck supple.  Cardiovascular: Normal rate.   Pulmonary/Chest: Effort normal.  Musculoskeletal: He exhibits tenderness. He exhibits no edema.       Left shoulder: He exhibits decreased range of motion, tenderness, pain and spasm. He exhibits no swelling, no crepitus,  no deformity, no laceration and normal pulse.       Left elbow: Normal.       Left wrist: Normal.       Cervical back: Normal.       Left upper arm: Normal.       Left forearm: Normal.       Left hand: Normal.  Left AC joint tenderness. Diffuse tenderness and muscle spasm throughout the left shoulder and trapezius. Distal sensation intact. Distal pulses intact. Capillary refill less than 3 seconds. Good grip strength. Spine nontender, no crepitus, or stepoffs.  Neurological: He is alert.  Skin: He is not diaphoretic.  Nursing note and vitals reviewed.   ED Course  Procedures   DIAGNOSTIC STUDIES: Oxygen Saturation is 100% on RA, normal by my interpretation.    COORDINATION OF CARE: 12:32 PM Will order Norco and Robaxin. Discussed treatment plan with pt at bedside and pt agreed to plan.  12:51 PM Returned to discuss results or left shoulder x-ray. Advised pt to follow-up with orthopedist Dr. Blackman.  Imaging Review Dg Shoulder Left  6/4/Magnus Ivan2017  CLINICAL DATA:  Fall from ladder, left shoulder injury, pain EXAM: LEFT SHOULDER - 2+ VIEW COMPARISON:  06/15/2015, 07/21/2015 FINDINGS: Moderate AC joint degenerative change as before. Normal left shoulder alignment. No dislocation or separation. Negative for acute osseous finding or fracture. IMPRESSION: Moderate AC joint degenerative change.  Stable exam. Electronically Signed   By: Judie PetitM.  Shick M.D.   On: 11/22/2015 12:44   I have personally reviewed and evaluated these images as part of my medical decision-making.    MDM   Final diagnoses:  Shoulder injury, left, initial encounter    Afebrile, nontoxic patient with injury to his left shoulder while falling from a ladder.   Xray negative.  Neurovascularly intact.  Decreased ROM secondary to pain.  Significant associated muscle spasm.  +AC joint tenderness.  Concern for sprain/strain.  Pt has seen Dr Magnus IvanBlackman for other shoulder, would like to see him for this side as well.   D/C home with  norco, robaxin, orthopedic follow up.  Discussed result, findings, treatment, and follow up  with patient.  Pt given return precautions.  Pt verbalizes understanding and agrees with plan.        I personally performed the services described in this documentation, which was scribed in my presence. The recorded information has been reviewed and is accurate.    Trixie Dredgemily Keysha Damewood, PA-C 11/22/15 1321  Derwood KaplanAnkit Nanavati, MD 11/22/15 1610

## 2015-11-22 NOTE — ED Notes (Signed)
Pt fell off a ladder at 1 am today.  Landing on left shoulder.  Painful

## 2016-02-05 ENCOUNTER — Emergency Department (HOSPITAL_COMMUNITY): Payer: Medicaid Other

## 2016-02-05 ENCOUNTER — Emergency Department (HOSPITAL_COMMUNITY)
Admission: EM | Admit: 2016-02-05 | Discharge: 2016-02-05 | Disposition: A | Discharge status: Home / Self Care | Payer: Medicaid Other | Attending: Emergency Medicine | Admitting: Emergency Medicine

## 2016-02-05 ENCOUNTER — Encounter (HOSPITAL_COMMUNITY): Payer: Self-pay | Admitting: *Deleted

## 2016-02-05 DIAGNOSIS — W11XXXA Fall on and from ladder, initial encounter: Secondary | ICD-10-CM | POA: Insufficient documentation

## 2016-02-05 DIAGNOSIS — Y9339 Activity, other involving climbing, rappelling and jumping off: Secondary | ICD-10-CM | POA: Diagnosis not present

## 2016-02-05 DIAGNOSIS — F17210 Nicotine dependence, cigarettes, uncomplicated: Secondary | ICD-10-CM | POA: Diagnosis not present

## 2016-02-05 DIAGNOSIS — S4992XA Unspecified injury of left shoulder and upper arm, initial encounter: Secondary | ICD-10-CM | POA: Diagnosis present

## 2016-02-05 DIAGNOSIS — S0990XA Unspecified injury of head, initial encounter: Secondary | ICD-10-CM | POA: Diagnosis not present

## 2016-02-05 DIAGNOSIS — Y999 Unspecified external cause status: Secondary | ICD-10-CM | POA: Diagnosis not present

## 2016-02-05 DIAGNOSIS — Y9289 Other specified places as the place of occurrence of the external cause: Secondary | ICD-10-CM | POA: Diagnosis not present

## 2016-02-05 MED ORDER — METHOCARBAMOL 500 MG PO TABS: 500.0000 mg | ORAL_TABLET | Freq: Four times a day (QID) | ORAL | 0 refills | Status: AC | PRN

## 2016-02-05 MED ORDER — FENTANYL CITRATE (PF) 100 MCG/2ML IJ SOLN
50.0000 ug | INTRAMUSCULAR | Status: DC | PRN
  Administered 2016-02-05: 50 ug via NASAL

## 2016-02-05 MED ORDER — TRAMADOL HCL 50 MG PO TABS: 50.0000 mg | ORAL_TABLET | Freq: Four times a day (QID) | ORAL | 0 refills | Status: AC | PRN

## 2016-02-05 MED ORDER — OXYCODONE-ACETAMINOPHEN 5-325 MG PO TABS
1.0000 | ORAL_TABLET | Freq: Once | ORAL | Status: AC
  Administered 2016-02-05: 1 via ORAL
  Filled 2016-02-05: qty 1

## 2016-02-05 MED ORDER — FENTANYL CITRATE (PF) 100 MCG/2ML IJ SOLN
INTRAMUSCULAR | Status: AC
  Filled 2016-02-05: qty 2

## 2016-02-05 NOTE — Discharge Instructions (Signed)
You have been evaluated for your fall.  CT scan of your head and neck showing no significant injury.  Xrays of your left shoulder and lower back is normal.  Please wear sling as needed for support.  Follow instruction below for care.  Follow up with orthopedist as needed if your condition worsen.  Return to the ER if you have any concerns.

## 2016-02-05 NOTE — ED Triage Notes (Addendum)
PT states fell off 12 foot ladder, taking most of the impact on his L shoulder.  Now experiencing pain to L shoulder, R lower back and post neck.  No loc.  C-collar placed in triage.

## 2016-02-05 NOTE — ED Provider Notes (Signed)
[facility]-EMERGENCY DEPT Provider Note   CSN: [id number] Arrival date & time: 02/05/16  1446     History   Chief Complaint Chief Complaint  Patient presents with  . Fall    off 12 foot ladder    HPI Jared Spencer is a 40 y.o. male.  HPI   40 year old male presents for evaluation of a recent fall. 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 Once he was at the top of the ladder, he lost his balance, and fell backward striking the ground with his back. Patient states he did use his right arm to brace the fall. He denies hitting his head or loss of consciousness. Sts that his L shoulder and lower back took the brunt of the impact.  Report feeling/hearing a pop to L shoulder upon impact.  Report acute onset of pain to his neck, left shoulder, and lower back. Pain is sharp, throbbing, persistent, 10 out of 10. He was able to ambulate afterward. Family member brought patient to the ER for further evaluation. Patient states he did receive fentanyl while waiting but the medication provided no relief. At this time he denies any severe headache, nausea, vomiting, chest pain, difficulty breathing, abdominal pain, hip pain, or leg pain. He denies any precipitating symptoms prior to the fall.  Past Medical History:  Diagnosis Date  . Heart attack [facility])    pt reports that all tests were negative for MI    There are no active problems to display for this patient.   Past Surgical History:  Procedure Laterality Date  . FINGER SURGERY Left   . ROTATOR CUFF REPAIR Right        Home Medications    Prior to Admission medications   Medication Sig Start Date End Date Taking? Authorizing Provider  acetaminophen (TYLENOL) 500 MG tablet Take 1,000 mg by mouth every 6 (six) hours as needed for mild pain or headache.    Historical Provider, MD  benzonatate (TESSALON) 100 MG capsule Take 1 capsule (100 mg total) by mouth every 8  (eight) hours. 07/07/15   Shawn C Joy, PA-C  HYDROcodone-acetaminophen (NORCO/VICODIN) 5-325 MG tablet Take 1-2 tablets by mouth every 4 (four) hours as needed for moderate pain or severe pain. 11/22/15   Trixie Dredge, PA-C  methocarbamol (ROBAXIN) 500 MG tablet Take 1-2 tablets (500-1,000 mg total) by mouth every 6 (six) hours as needed for muscle spasms (and pain). 11/22/15   Trixie Dredge, PA-C  Multiple Vitamins-Minerals (MULTIVITAMIN ADULT PO) Take 1 tablet by mouth daily.    Historical Provider, MD  ondansetron (ZOFRAN ODT) 4 MG disintegrating tablet Take 1 tablet (4 mg total) by mouth every 8 (eight) hours as needed for nausea or vomiting. 07/07/15   Anselm Pancoast, PA-C    Family History Family History  Problem Relation Age of Onset  . Heart attack Other     Social History Social History  Substance Use Topics  . Smoking status: Current Some Day Smoker    Packs/day: 0.25    Types: Cigarettes  . Smokeless tobacco: Never Used  . Alcohol use No     Allergies   Ibuprofen; Sulfa antibiotics; and Benadryl [diphenhydramine hcl]   Review of Systems Review of Systems  All other systems reviewed and are negative.    Physical Exam Updated Vital Signs BP 119/91 (BP Location: Right Arm)   Pulse 101   Temp 98.9 F (  37.2 C) (Oral)   Resp 20   Ht 5\' 5"  (1.651 m)   Wt 72.6 kg   SpO2 98%   BMI 26.63 kg/m   Physical Exam  Constitutional: He appears well-developed and well-nourished. No distress.  HENT:  Head: Normocephalic and atraumatic.  Eyes: Conjunctivae and EOM are normal. Pupils are equal, round, and reactive to light.  Neck: Normal range of motion. Neck supple.  Mild cervical paraspinal muscle tenderness on palpation without significant midline tenderness crepitus or step-off.  Cardiovascular: Normal rate and regular rhythm.   Pulmonary/Chest: Effort normal and breath sounds normal. He exhibits no tenderness.  Abdominal: Soft. There is no tenderness.  Musculoskeletal: He  exhibits tenderness (Left shoulder: Tenderness throughout shoulder and along left AC joint on palpation without crepitus or deformity ).  Tenderness along L-spine midline at the level of L3-S1 on palpation without crepitus or step-off. No overlying skin changes. Bilateral hips stable.  Neurological: He is alert.  Skin: No rash noted.  Psychiatric: He has a normal mood and affect.  Nursing note and vitals reviewed.    ED Treatments / Results  Labs (all labs ordered are listed, but only abnormal results are displayed) Labs Reviewed - No data to display  EKG  EKG Interpretation None       Radiology Dg Lumbar Spine Complete  Result Date: 02/05/2016 CLINICAL DATA:  Fall today from a 12 foot ladder. Pt states he braced his fall with his left arm and shoulder. Pain in left shoulder, neck, and lower back. EXAM: LUMBAR SPINE - COMPLETE 4+ VIEW COMPARISON:  None. FINDINGS: There are transitional thoracolumbar and lumbosacral vertebrae. There is a defect along the posterior elements of the upper sacrum which may be postsurgical or developmental. There is no acute fracture. There is no spondylolisthesis. The disc spaces and facet joints are well maintained. Soft tissues are unremarkable. IMPRESSION: 1. No fracture or acute finding. Electronically Signed   By: Amie Portlandavid  Ormond M.D.   On: 02/05/2016 16:45   Ct Head Wo Contrast  Result Date: 02/05/2016 CLINICAL DATA:  Initial evaluation for acute trauma, fall. EXAM: CT HEAD WITHOUT CONTRAST CT CERVICAL SPINE WITHOUT CONTRAST TECHNIQUE: Multidetector CT imaging of the head and cervical spine was performed following the standard protocol without intravenous contrast. Multiplanar CT image reconstructions of the cervical spine were also generated. COMPARISON:  Prior CT from 03/01/2015. FINDINGS: CT HEAD FINDINGS There is no acute intracranial hemorrhage or infarct. No mass lesion or midline shift. Gray-white matter differentiation is well maintained.  Ventricles are normal in size without evidence of hydrocephalus. CSF containing spaces are within normal limits. No extra-axial fluid collection. The calvarium is intact. Orbital soft tissues are within normal limits. Remote defect noted at the right lamina papyracea. The paranasal sinuses and mastoid air cells are well pneumatized and free of fluid. Scalp soft tissues are unremarkable. CT CERVICAL SPINE FINDINGS The vertebral bodies are normally aligned with preservation of the normal cervical lordosis. Vertebral body heights are preserved. Normal C1-2 articulations are intact. No prevertebral soft tissue swelling. No acute fracture or listhesis. Mild degenerative spondylolysis present at C4-5. Visualized soft tissues of the neck are within normal limits. Visualized lung apices are clear without evidence of apical pneumothorax. Emphysema noted. IMPRESSION: CT BRAIN: No acute intracranial process identified. CT CERVICAL SPINE: 1. No acute traumatic injury within the cervical spine. 2. Severe emphysema. Electronically Signed   By: Rise MuBenjamin  McClintock M.D.   On: 02/05/2016 16:33   Ct Cervical Spine Wo Contrast  Result  Date: 02/05/2016 CLINICAL DATA:  Initial evaluation for acute trauma, fall. EXAM: CT HEAD WITHOUT CONTRAST CT CERVICAL SPINE WITHOUT CONTRAST TECHNIQUE: Multidetector CT imaging of the head and cervical spine was performed following the standard protocol without intravenous contrast. Multiplanar CT image reconstructions of the cervical spine were also generated. COMPARISON:  Prior CT from 03/01/2015. FINDINGS: CT HEAD FINDINGS There is no acute intracranial hemorrhage or infarct. No mass lesion or midline shift. Gray-white matter differentiation is well maintained. Ventricles are normal in size without evidence of hydrocephalus. CSF containing spaces are within normal limits. No extra-axial fluid collection. The calvarium is intact. Orbital soft tissues are within normal limits. Remote defect noted  at the right lamina papyracea. The paranasal sinuses and mastoid air cells are well pneumatized and free of fluid. Scalp soft tissues are unremarkable. CT CERVICAL SPINE FINDINGS The vertebral bodies are normally aligned with preservation of the normal cervical lordosis. Vertebral body heights are preserved. Normal C1-2 articulations are intact. No prevertebral soft tissue swelling. No acute fracture or listhesis. Mild degenerative spondylolysis present at C4-5. Visualized soft tissues of the neck are within normal limits. Visualized lung apices are clear without evidence of apical pneumothorax. Emphysema noted. IMPRESSION: CT BRAIN: No acute intracranial process identified. CT CERVICAL SPINE: 1. No acute traumatic injury within the cervical spine. 2. Severe emphysema. Electronically Signed   By: Rise Mu M.D.   On: 02/05/2016 16:33   Dg Shoulder Left  Result Date: 02/05/2016 CLINICAL DATA:  Fall today from a 12 foot ladder. Pt states he braced his fall with his left arm and shoulder. Pain in left shoulder, neck, and lower back. Unable to obtain axillary view due to pain level. EXAM: LEFT SHOULDER - 2+ VIEW COMPARISON:  11/22/2015. FINDINGS: No fracture. Mild AC joint osteoarthritic change. The glenohumeral joint is normally spaced and aligned. Soft tissues are unremarkable. IMPRESSION: No fracture or dislocation. Electronically Signed   By: Amie Portland M.D.   On: 02/05/2016 16:47    Procedures Procedures (including critical care time)  Medications Ordered in ED Medications  fentaNYL (SUBLIMAZE) injection 50 mcg (50 mcg Nasal Given 02/05/16 1537)  fentaNYL (SUBLIMAZE) 100 MCG/2ML injection (not administered)  oxyCODONE-acetaminophen (PERCOCET/ROXICET) 5-325 MG per tablet 1 tablet (1 tablet Oral Given 02/05/16 1907)     Initial Impression / Assessment and Plan / ED Course  I have reviewed the triage vital signs and the nursing notes.  Pertinent labs & imaging results that were  available during my care of the patient were reviewed by me and considered in my medical decision making (see chart for details).  Clinical Course    BP 127/83 (BP Location: Right Arm)   Pulse 74   Temp 98.9 F (37.2 C) (Oral)   Resp 20   Ht 5\' 5"  (1.651 m)   Wt 72.6 kg   SpO2 100%   BMI 26.63 kg/m    Final Clinical Impressions(s) / ED Diagnoses   Final diagnoses:  Fall from ladder, initial encounter  Injury of left shoulder, initial encounter    New Prescriptions New Prescriptions   No medications on file   7:05 PM Patient fell down a 12 foot ladder onto a grassy ground a properly 6 hours ago. His primary complaint is pain to his neck, left shoulder and low back. Head CT scan, cervical spine CT, left shoulder, and L-spine without acute fractures or dislocation. He does not have any significant chest pain or shortness of breath or abdominal pain to suggest internal injury. No  pain to his foot and ankle on exam. He did not land on his foot, doubt calcaneal injury. He is able to ambulate. Pain medication given.  Pt may have ligamentous injury in his L shoulder not shown on xray.  Will provide sling to use as needed for support.  Ortho referral given.  D/c with pain medication  In order to decrease risk of narcotic abuse. Pt's record were checked using the [place] Controlled Substance database.      Fayrene HelperBowie Maruice Pieroni, PA-C 02/05/16 2002    Vanetta MuldersScott Zackowski, MD 02/05/16 2221

## 2016-03-14 IMAGING — MR MR SHOULDER*L* W/O CM
4 of 5 series · 14 of 40 positions shown · non-contrast
Comparison: 10/23/2014

CLINICAL DATA: Left shoulder pain. History of prior rotator cuff
repair November 2014. Pain with raising the arm.

EXAM:
MRI OF THE LEFT SHOULDER WITHOUT CONTRAST
TECHNIQUE: Multiplanar, multisequence MR imaging of the shoulder was performed.
No intravenous contrast was administered.

[Series 6: T2 fat-sat · axial · left · 3.0mm · 0.44mm/px · z∈[-3,+68]mm · 3 of 25 slices shown (1 of 3)]
[im 3/25]
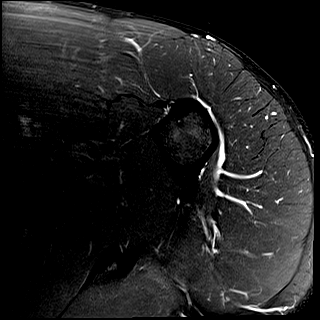
[im 14/25]
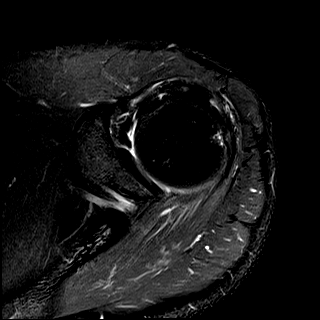
[im 22/25]
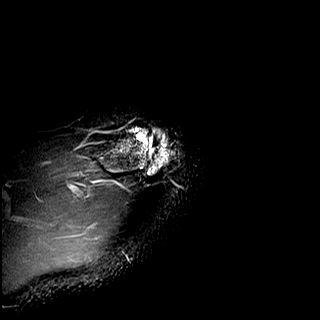

[Series 7: PD · oblique · left · 3.0mm · 0.18mm/px · 5 of 21 slices shown]
[im 1/21]
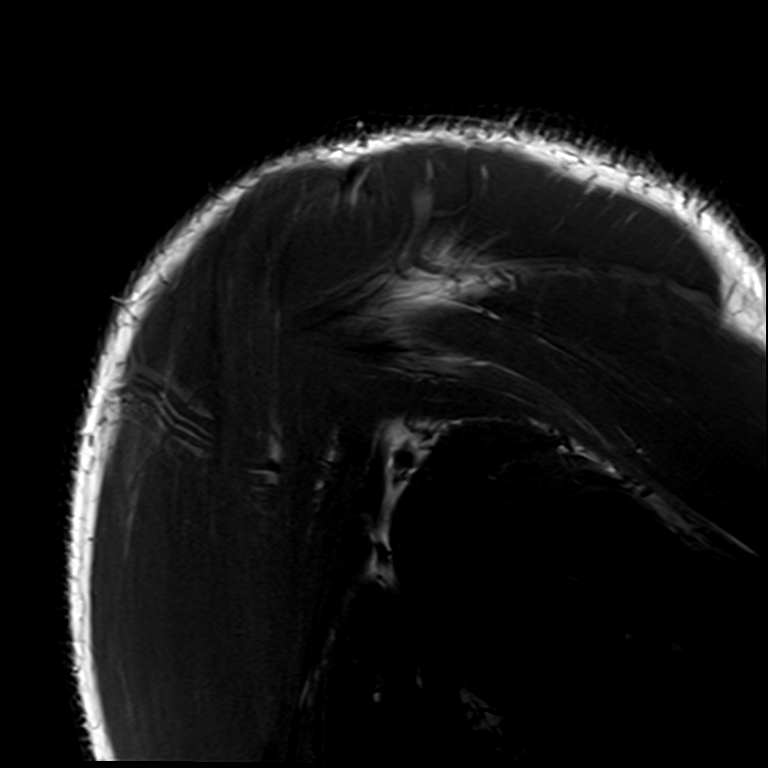
[im 4/21]
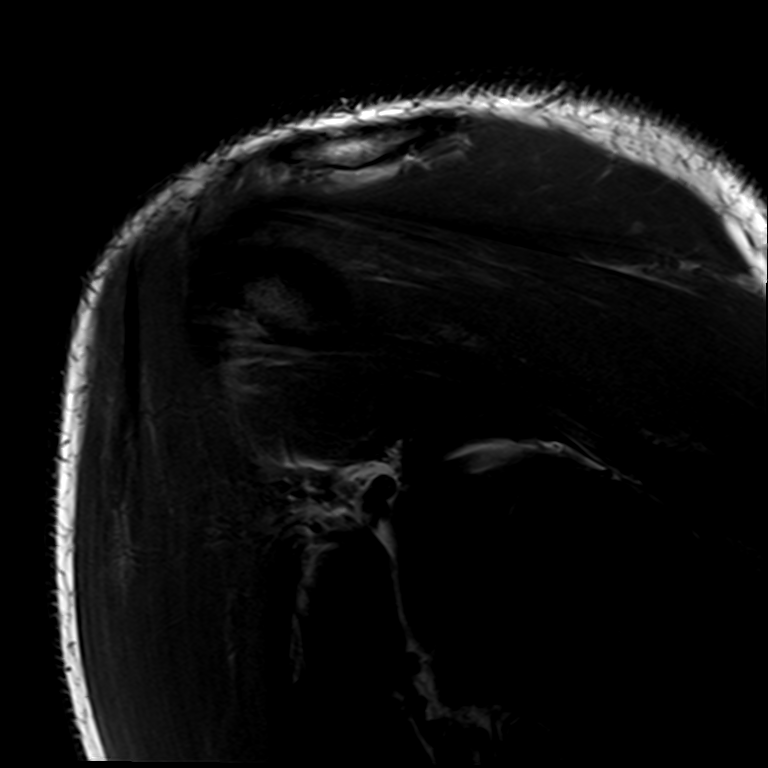
[im 7/21]
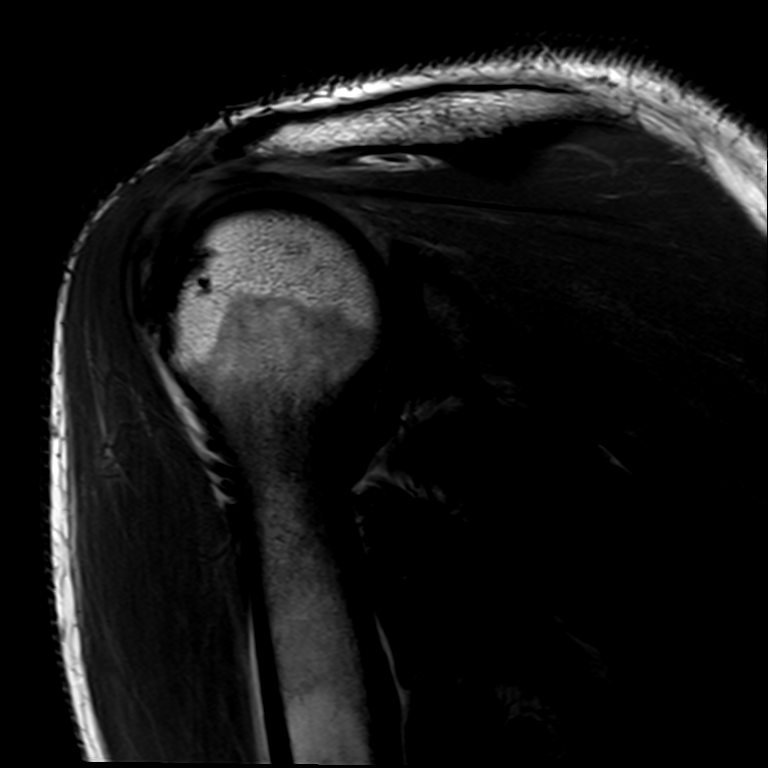
[im 11/21]
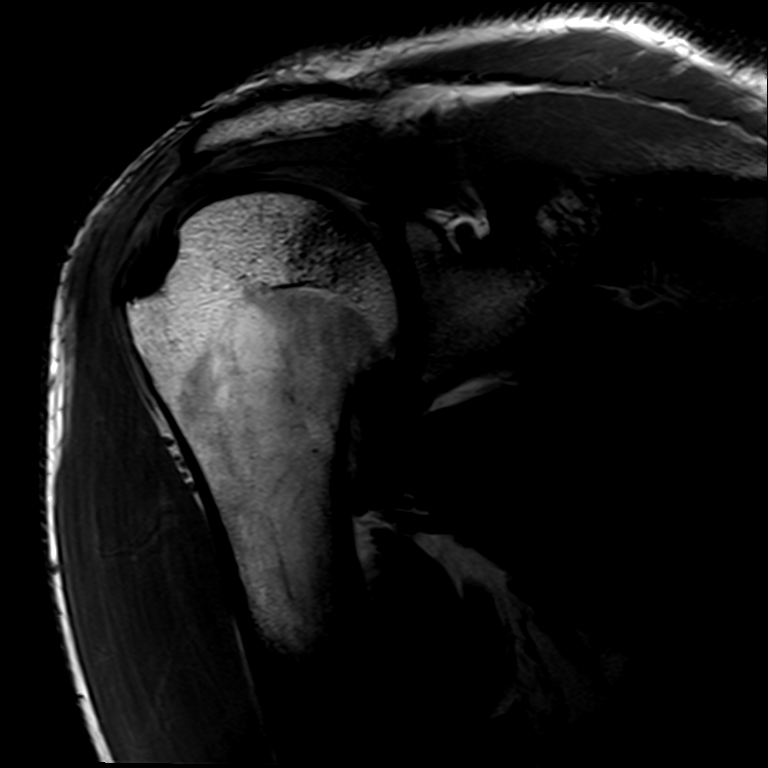
[im 17/21]
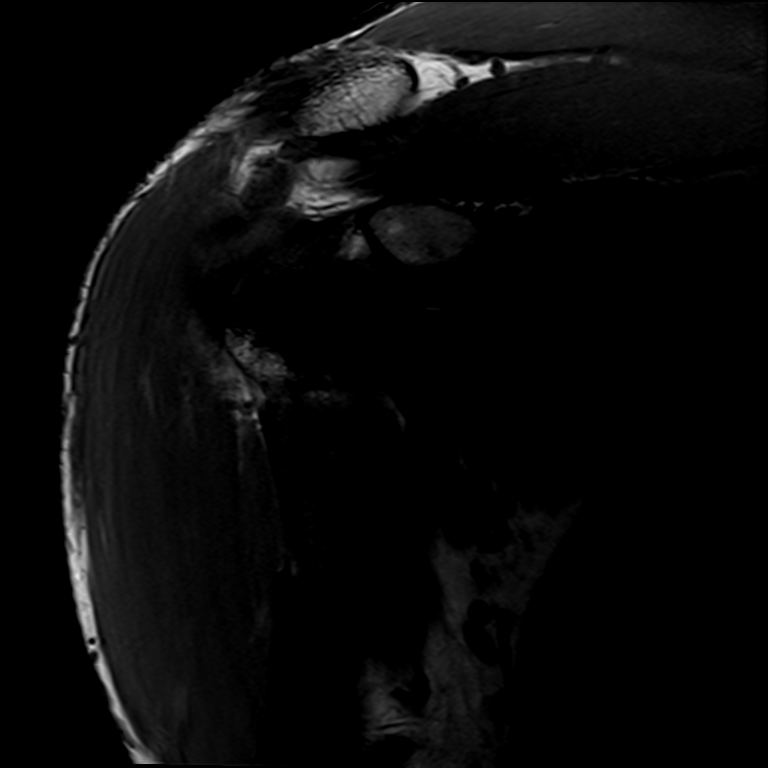

[Series 8: T2 fat-sat · oblique · left · 3.0mm · 0.22mm/px · 3 of 21 slices shown (2 of 3)]
[im 4/21]
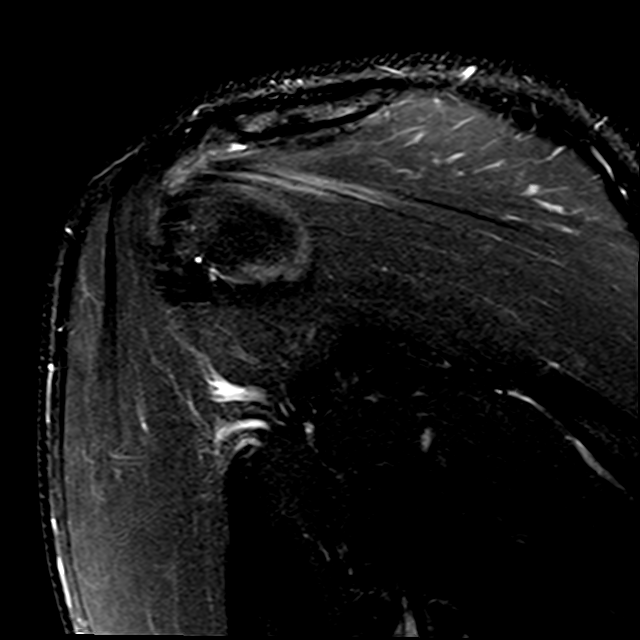
[im 11/21]
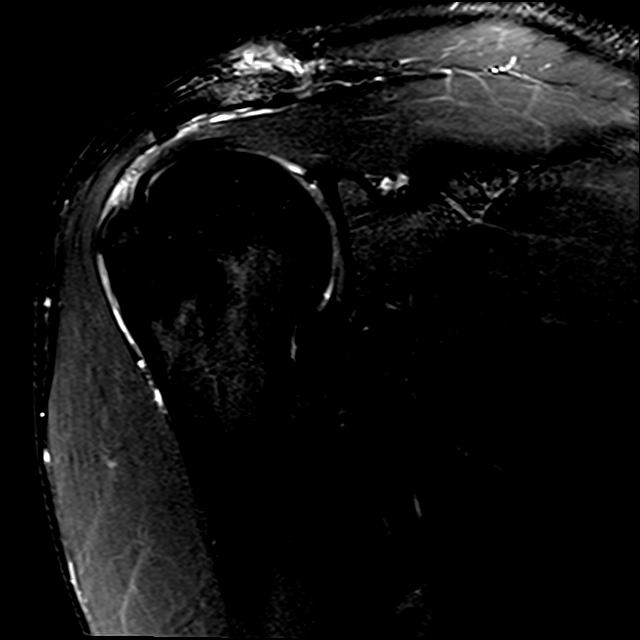
[im 17/21]
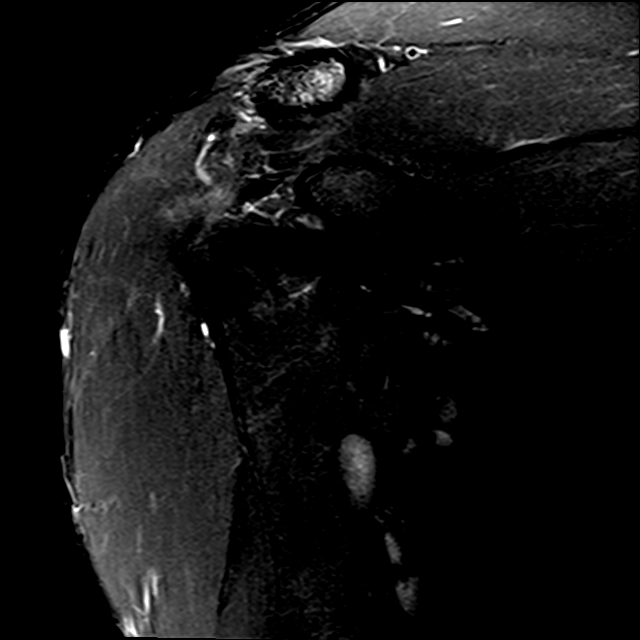

[Series 9: T2 fat-sat · coronal · left · 3.0mm · 0.44mm/px · 3 of 24 slices shown (3 of 3)]
[im 4/24]
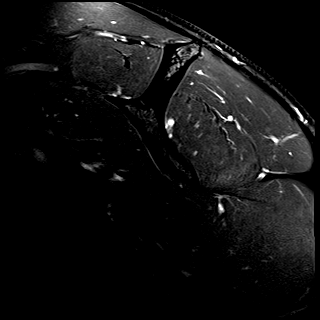
[im 14/24]
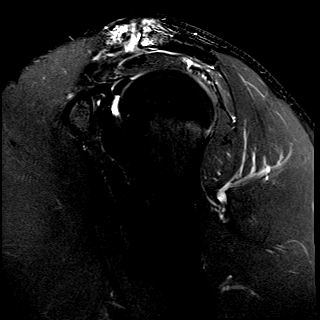
[im 20/24]
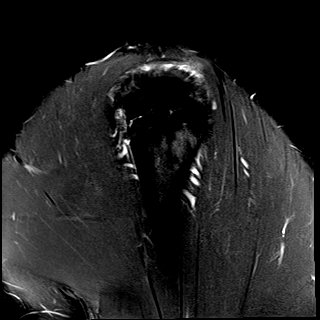

[14 of 40 positions shown; findings below may reference images not displayed]

FINDINGS: Rotator cuff: Moderate focal tendinosis of the anterior
supraspinatus tendon hit with fraying along the bursal surface may
reflect and a small partial tear versus postsurgical changes from
prior rotator cuff repair. Moderate tendinosis of the infraspinatus
tendon with a small partial thickness bursal surface tear. Teres
minor tendon is intact. Subscapularis tendon is intact.

Muscles: No atrophy or fatty replacement of nor abnormal signal
within, the muscles of the rotator cuff.

Biceps long head:  Intact.

Acromioclavicular Joint: Severe degenerative changes of the
acromioclavicular joint with marrow edema on either side of the
joint. Type I acromion. Small amount of subacromial/subdeltoid
bursal fluid.

Glenohumeral Joint: No joint effusion. No chondral defect. No
dislocation.

Labrum: Grossly intact, but evaluation is limited by lack of
intraarticular fluid.

Bones: No focal marrow signal abnormality. No acute fracture. No
aggressive osseous lesion.
IMPRESSION: 1. Moderate focal tendinosis of the anterior supraspinatus tendon
hit with fraying along the bursal surface may reflect and a small
partial tear versus postsurgical changes from prior rotator cuff
repair.
2. Moderate tendinosis of the infraspinatus tendon with a small
partial thickness bursal surface tear.
3. Severe degenerative changes of the acromioclavicular joint with
marrow edema on either side of joint.

## 2022-06-20 DEATH — deceased
